# Patient Record
Sex: Female | Born: 1935 | Race: White | Hispanic: No | Smoking: Former smoker
Health system: Southern US, Community
[De-identification: ages and names within clinical notes are randomized; demographics above are authoritative.]

## PROBLEM LIST (undated history)

## (undated) DIAGNOSIS — I728 Aneurysm of other specified arteries: Secondary | ICD-10-CM

## (undated) DIAGNOSIS — C189 Malignant neoplasm of colon, unspecified: Secondary | ICD-10-CM

## (undated) DIAGNOSIS — I10 Essential (primary) hypertension: Secondary | ICD-10-CM

## (undated) DIAGNOSIS — C679 Malignant neoplasm of bladder, unspecified: Secondary | ICD-10-CM

## (undated) DIAGNOSIS — M549 Dorsalgia, unspecified: Secondary | ICD-10-CM

## (undated) DIAGNOSIS — Z8551 Personal history of malignant neoplasm of bladder: Secondary | ICD-10-CM

## (undated) HISTORY — PX: ABDOMINAL SURGERY: SHX537

## (undated) HISTORY — PX: TEMPORAL ARTERY BIOPSY / LIGATION: SUR132

## (undated) HISTORY — PX: ABDOMINAL HYSTERECTOMY: SHX81

---

## 2009-01-30 LAB — URINALYSIS W/ REFLEX CULTURE
Bacteria: NEGATIVE /HPF
Bilirubin: NEGATIVE
Glucose: NEGATIVE MG/DL
Ketone: NEGATIVE MG/DL
Nitrites: NEGATIVE
Specific gravity: 1.011 (ref 1.003–1.030)
Urobilinogen: 0.2 EU/DL (ref 0.2–1.0)
pH (UA): 6 (ref 5.0–8.0)

## 2009-01-30 LAB — CBC WITH AUTOMATED DIFF
ABS. BASOPHILS: 0 10*3/uL (ref 0.0–0.1)
ABS. EOSINOPHILS: 0.1 10*3/uL (ref 0.0–0.4)
ABS. LYMPHOCYTES: 1.4 10*3/uL (ref 0.8–3.5)
ABS. MONOCYTES: 0.4 10*3/uL (ref 0.0–1.0)
ABS. NEUTROPHILS: 3.9 10*3/uL (ref 1.8–8.0)
BASOPHILS: 0 % (ref 0–1)
EOSINOPHILS: 1 % (ref 0–7)
HCT: 41.5 % (ref 35.0–47.0)
HGB: 14.1 g/dL (ref 11.5–16.0)
LYMPHOCYTES: 25 % (ref 12–49)
MCH: 28.7 PG (ref 26.0–34.0)
MCHC: 34 g/dL (ref 30.0–36.5)
MCV: 84.5 FL (ref 80.0–99.0)
MONOCYTES: 7 % (ref 5–13)
NEUTROPHILS: 67 % (ref 32–75)
PLATELET: 211 10*3/uL (ref 150–400)
RBC: 4.91 M/uL (ref 3.80–5.20)
RDW: 13.5 % (ref 11.5–14.5)
WBC: 5.8 10*3/uL (ref 3.6–11.0)

## 2009-01-30 LAB — METABOLIC PANEL, COMPREHENSIVE
A-G Ratio: 1.1 (ref 1.1–2.2)
ALT (SGPT): 22 U/L (ref 12–78)
AST (SGOT): 16 U/L (ref 15–37)
Albumin: 3.8 g/dL (ref 3.5–5.0)
Alk. phosphatase: 60 U/L (ref 50–136)
Anion gap: 11 mmol/L (ref 5–15)
BUN/Creatinine ratio: 28 — ABNORMAL HIGH (ref 12–20)
BUN: 22 MG/DL — ABNORMAL HIGH (ref 6–20)
Bilirubin, total: 0.6 MG/DL (ref 0.2–1.0)
CO2: 28 MMOL/L (ref 21–32)
Calcium: 9.1 MG/DL (ref 8.5–10.1)
Chloride: 99 MMOL/L (ref 97–108)
Creatinine: 0.8 mg/dL (ref 0.6–1.3)
GFR est AA: >60 mL/min/{1.73_m2} (ref >60)
GFR est non-AA: >60 mL/min/{1.73_m2} (ref >60)
Globulin: 3.6 g/dL (ref 2.0–4.0)
Glucose: 92 MG/DL (ref 65–100)
Potassium: 4.2 MMOL/L (ref 3.5–5.1)
Protein, total: 7.4 g/dL (ref 6.4–8.2)
Sodium: 138 MMOL/L (ref 136–145)

## 2009-01-30 LAB — PROTHROMBIN TIME + INR
INR: 1 (ref 0.9–1.1)
Prothrombin time: 10 SECS (ref 9.0–11.0)

## 2009-01-30 LAB — PTT: aPTT: 27.5 s (ref 24.0–33.0)

## 2009-02-01 LAB — CULTURE, URINE
Colonies Counted: <1000
Colony Count: <1000
Culture result:: NO GROWTH
Culture: NO GROWTH

## 2009-02-12 NOTE — Op Note (Signed)
Op Notes filed by Arnetha Courser, MD at 02/13/09 1543                Author: Arnetha Courser, MD  Service: --  Author Type: Physician       Filed: 02/13/09 1543  Date of Service: 02/12/09 2325  Status: Addendum          Editor: Arnetha Courser, MD (Physician)          <!--EPICS--> Name:      Melissa Travis, Melissa Travis<BR> MR #:      [id number]                    Surgeon:        Arnetha Courser, MD<BR> Account #: [id number]                  Surgery Date:   02/12/2009<BR> DOB:       17-Jul-1934<BR> Age:       75                           Location:       5E2 512 A<BR> <BR>                              OPERATIVE REPORT<BR> <BR> <BR> <BR> PREOPERATIVE DIAGNOSIS:  Parastomal hernia.<BR>  <BR> POSTOPERATIVE DIAGNOSIS:  Parastomal hernia.<BR> <BR> PROCEDURE:  Repair of parastomal hernia.<BR> <BR> ESTIMATED BLOOD LOSS:<BR> <BR> SPECIMENS:<BR> <BR> PROCEDURE IN DETAIL:  After anesthesia, she was prepped and draped in a<BR> sterile manner.   Foley catheter was placed into the ileal conduit.  The<BR> balloon was inflated with 5 mL, and the stoma was closed with pursestring<BR> suture of 3-0 silk.  A circular incision was made around the stoma.  This<BR> was deepened to subcutaneous tissues  and eventually down to the level of<BR> the fascia.  The hernia was opened and excised.  The stoma was completely<BR> removed from the fascia in 360 degrees, mobilized, and then the<BR> undersurface of the abdominal wall was dissected so that it could  be<BR> brought together without harming in the intra-abdominal structures.  Using<BR> interrupted figure-of-eight sutures of #1 PDS, the abdominal wall fascia<BR> was brought together to tightened it around the stoma.  Three such sutures<BR> were placed  laterally and one inferiorly.  This closed the fascia nicely<BR> again to the stoma without strangulation.  At this point, the level of the<BR> stoma was adjusted and then interrupted 3-0 silk sutures were placed<BR> circumferentially  between the fascia  and then ileal conduit.  The wound was<BR> irrigated with antibiotics irrigation.  Hemostasis was obtained and then<BR> the skin edges were closed with interrupted 3-0 Vicryl sutures.  Sterile<BR> dressing was applied.  The patient was transferred to  the recovery in<BR> stable condition.<BR> <BR> <BR> <BR> <BR> E-Signed By<BR> Arnetha Courser, MD 02/13/2009 15:43<BR> Arnetha Courser, MD<BR> <BR> cc:   Arnetha Courser, MD<BR> <BR> <BR> COPY TO:  [facility]<BR> <BR> <BR> WRM/FI3;  D: 02/12/2009  1:02 P; T: 02/12/2009 11:25 P; Doc# [id number]; Job#<BR> [id number]<IO> <!--EPICE-->

## 2009-02-12 NOTE — Op Note (Addendum)
Name: Melissa Travis, Melissa Travis  MR #: [id number] Surgeon: Arnetha Courser, MD  Account #: [id number] Surgery Date: 02/12/2009  DOB: [birth year]  Age: 75 Location: 5E2 512 A     OPERATIVE REPORT        PREOPERATIVE DIAGNOSIS: Parastomal hernia.    POSTOPERATIVE DIAGNOSIS: Parastomal hernia.    PROCEDURE: Repair of parastomal hernia.    ESTIMATED BLOOD LOSS:    SPECIMENS:    PROCEDURE IN DETAIL: After anesthesia, she was prepped and draped in a  sterile manner. Foley catheter was placed into the ileal conduit. The  balloon was inflated with 5 mL, and the stoma was closed with pursestring  suture of 3-0 silk. A circular incision was made around the stoma. This  was deepened to subcutaneous tissues and eventually down to the level of  the fascia. The hernia was opened and excised. The stoma was completely  removed from the fascia in 360 degrees, mobilized, and then the  undersurface of the abdominal wall was dissected so that it could be  brought together without harming in the intra-abdominal structures. Using  interrupted figure-of-eight sutures of #1 PDS, the abdominal wall fascia  was brought together to tightened it around the stoma. Three such sutures  were placed laterally and one inferiorly. This closed the fascia nicely  again to the stoma without strangulation. At this point, the level of the  stoma was adjusted and then interrupted 3-0 silk sutures were placed  circumferentially between the fascia and then ileal conduit. The wound was  irrigated with antibiotics irrigation. Hemostasis was obtained and then  the skin edges were closed with interrupted 3-0 Vicryl sutures. Sterile  dressing was applied. The patient was transferred to the recovery in  stable condition.          E-Signed By  Arnetha Courser, MD 02/13/2009 15:43  Arnetha Courser, MD    cc: Arnetha Courser, MD      COPY TO: [facility]      WRM/FI3; D: 02/12/2009 1:02 P; T: 02/12/2009 11:25 P; Doc# [id number]; Job#  [id number]

## 2009-09-19 ENCOUNTER — Encounter

## 2011-02-24 LAB — CK W/ CKMB & INDEX
CK - MB: 1.1 NG/ML (ref 0.5–3.6)
CK-MB Index: 0.7 (ref 0–2.5)
CK: 151 U/L (ref 26–192)

## 2011-02-24 LAB — URINALYSIS W/ RFLX MICROSCOPIC
Bilirubin: NEGATIVE
Glucose: NEGATIVE MG/DL
Ketone: NEGATIVE MG/DL
Nitrites: POSITIVE — AB
Protein: NEGATIVE MG/DL
Specific gravity: 1.01 (ref 1.003–1.030)
Urobilinogen: 0.2 EU/DL (ref 0.2–1.0)
pH (UA): 7 (ref 5.0–8.0)

## 2011-02-24 LAB — URINE MICROSCOPIC ONLY

## 2011-02-24 LAB — EKG, 12 LEAD, INITIAL
Atrial Rate: 64 {beats}/min
Calculated P Axis: 38 degrees
Calculated R Axis: -15 degrees
Calculated T Axis: 12 degrees
Diagnosis: NORMAL
P-R Interval: 150 ms
Q-T Interval: 422 ms
QRS Duration: 88 ms
QTC Calculation (Bezet): 435 ms
Ventricular Rate: 64 {beats}/min

## 2011-02-24 LAB — CBC WITH AUTOMATED DIFF
ABS. BASOPHILS: 0 10*3/uL (ref 0.0–0.1)
ABS. EOSINOPHILS: 0.1 10*3/uL (ref 0.0–0.4)
ABS. LYMPHOCYTES: 1.2 10*3/uL (ref 0.8–3.5)
ABS. MONOCYTES: 0.7 10*3/uL (ref 0.0–1.0)
ABS. NEUTROPHILS: 7.2 10*3/uL (ref 1.8–8.0)
BASOPHILS: 0 % (ref 0–1)
EOSINOPHILS: 1 % (ref 0–7)
HCT: 38.8 % (ref 35.0–47.0)
HGB: 13.6 g/dL (ref 11.5–16.0)
LYMPHOCYTES: 13 % (ref 12–49)
MCH: 29.6 PG (ref 26.0–34.0)
MCHC: 35.1 g/dL (ref 30.0–36.5)
MCV: 84.5 FL (ref 80.0–99.0)
MONOCYTES: 8 % (ref 5–13)
NEUTROPHILS: 78 % — ABNORMAL HIGH (ref 32–75)
PLATELET: 235 10*3/uL (ref 150–400)
RBC: 4.59 M/uL (ref 3.80–5.20)
RDW: 12.5 % (ref 11.5–14.5)
WBC: 9.2 10*3/uL (ref 3.6–11.0)

## 2011-02-24 LAB — METABOLIC PANEL, COMPREHENSIVE
A-G Ratio: 0.7 — ABNORMAL LOW (ref 1.1–2.2)
ALT (SGPT): 33 U/L (ref 12–78)
AST (SGOT): 19 U/L (ref 15–37)
Albumin: 3.1 g/dL — ABNORMAL LOW (ref 3.5–5.0)
Alk. phosphatase: 82 U/L (ref 50–136)
Anion gap: 3 mmol/L — ABNORMAL LOW (ref 5–15)
BUN/Creatinine ratio: 18 (ref 12–20)
BUN: 16 MG/DL (ref 6–20)
Bilirubin, total: 0.5 MG/DL (ref 0.2–1.0)
CO2: 35 MMOL/L — ABNORMAL HIGH (ref 21–32)
Calcium: 8.4 MG/DL — ABNORMAL LOW (ref 8.5–10.1)
Chloride: 100 MMOL/L (ref 97–108)
Creatinine: 0.9 MG/DL (ref 0.6–1.3)
GFR est AA: >60 mL/min/{1.73_m2} (ref >60)
GFR est non-AA: >60 mL/min/{1.73_m2} (ref >60)
Globulin: 4.2 g/dL — ABNORMAL HIGH (ref 2.0–4.0)
Glucose: 100 MG/DL (ref 65–100)
Potassium: 3.6 MMOL/L (ref 3.5–5.1)
Protein, total: 7.3 g/dL (ref 6.4–8.2)
Sodium: 138 MMOL/L (ref 136–145)

## 2011-02-24 LAB — TROPONIN I: Troponin-I, Qt.: <0.04 ng/mL (ref <0.05)

## 2011-02-24 MED ORDER — OXYCODONE-ACETAMINOPHEN 5 MG-325 MG TAB
5-325 mg | ORAL_TABLET | ORAL | Status: AC | PRN
Start: 2011-02-24 — End: 2011-03-10

## 2011-02-24 MED ORDER — DOCUSATE SODIUM 100 MG CAP
100 mg | ORAL_CAPSULE | Freq: Two times a day (BID) | ORAL | Status: AC
Start: 2011-02-24 — End: 2011-05-25

## 2011-02-24 MED ORDER — DOXYCYCLINE HYCLATE 100 MG TAB
100 mg | Freq: Two times a day (BID) | ORAL | Status: DC
Start: 2011-02-24 — End: 2011-02-24
  Administered 2011-02-24: 11:00:00 via ORAL

## 2011-02-24 MED ORDER — DOXYCYCLINE 100 MG CAP
100 mg | ORAL_CAPSULE | Freq: Two times a day (BID) | ORAL | Status: AC
Start: 2011-02-24 — End: 2011-03-10

## 2011-02-24 MED ADMIN — naproxen (NAPROSYN) tablet 250 mg: ORAL | @ 12:00:00 | NDC 68462018801

## 2011-02-24 NOTE — ED Provider Notes (Signed)
Patient is a 77 y.o. female presenting with neck pain and joint pain. The history is provided by the patient.   Neck Pain     Joint Pain   Associated symptoms include neck pain.      A 77 year old who comes to the emergency department today complaining of 4 days of neck and joint pain which she describes gradual onset, constant, intense in severity, achy, and exacerbated by movement. There no alleviating factors. Patient does have a pain in her neck left hip and bilateral shoulders. Patient states that she took a tick off of her right inner thigh on Saturday. Patient notes that she felt feverish a few days ago, but she does not know how high her fever was.    Past Medical History   Diagnosis Date   ??? Hypertension    ??? Cancer      bladder, skin cancer        Past Surgical History   Procedure Date   ??? Complete urostomy care    ??? Hx urological      urostomy   ??? Abdomen surgery proc unlisted      tummy tuck         No family history on file.     History     Social History   ??? Marital Status: Married     Spouse Name: N/A     Number of Children: N/A   ??? Years of Education: N/A     Occupational History   ??? Not on file.     Social History Main Topics   ??? Smoking status: Former Smoker   ??? Smokeless tobacco: Not on file   ??? Alcohol Use: No   ??? Drug Use:    ??? Sexually Active:      Other Topics Concern   ??? Not on file     Social History Narrative   ??? No narrative on file                  ALLERGIES: Latex      Review of Systems   Constitutional: Positive for fever.   HENT: Positive for neck pain.    Musculoskeletal: Positive for joint pain and joint swelling.   [all other systems reviewed and are negative        Filed Vitals:    02/24/11 0613 02/24/11 0617   BP:  198/89   Pulse:  67   Temp:  97.8 ??F (36.6 ??C)   Resp:  16   Height: 5\' 2"  (1.575 m)    Weight: 70.308 kg (155 lb)    SpO2:  97%            Physical Exam   [nursing notereviewed.     CONSTITUTIONAL: Well-appearing; well-nourished; in moderate pain   HEAD: Normocephalic; atraumatic  EYES: PERRL; EOM intact; conjunctiva and sclera are clear bilaterally.  ENT: No rhinorrhea; normal pharynx with no tonsillar hypertrophy; mucous membranes pink/moist, no erythema, no exudate.  NECK: tender bilateral paraspinal areas with decreased ROM  CARD: Normal S1, S2; no murmurs, rubs, or gallops. Regular rate and rhythm.  RESP: Normal respiratory effort; breath sounds clear and equal bilaterally; no wheezes, rhonchi, or rales.  ABD: Normal bowel sounds; non-distended; non-tender; no palpable organomegaly, no masses, no bruits.  Back Exam: Normal inspection; no vertebral point tenderness, no CVA tenderness. Normal range of motion.  EXT: Bilateral shoulders tender, decreased ROM of left shoulder, pain with ROM left hip, all joints appear unremarkable  SKIN: Warm; dry; no rash.  NEURO:Alert and oriented x 3, coherent, NII-XII grossly intact, sensory and motor are non-focal.    EKG:  NSR with rate of 64, normal axis, no ectopy or ST changes.    MDM     Differential Diagnosis; Clinical Impression; Plan:     [A/P:  77 year old with arthalgias following a tick bite.  1.  Lyme studies pending  2.  Treat with doxycyline  3.  Percocet prn  3.  Follow up with PCP, return for persistent/worsening symptoms  4.  Patient signed out to oncoming physician      Procedures    None    Leslieann Whisman A. Brinda Focht, MD.  MyChart Activation    Thank you for requesting access to MyChart. Please follow the instructions below to securely access and download your online medical record. MyChart allows you to send messages to your doctor, view your test results, renew your prescriptions, schedule appointments, and more.    How Do I Sign Up?    1. In your internet browser, go to [email or web address]  2. Click on the First Time User? Click Here link in the Sign In box. You will be redirect to the New Member Sign Up page.   3. Enter your MyChart Access Code exactly as it appears below. You will not need to use this code after you???ve completed the sign-up process. If you do not sign up before the expiration date, you must request a new code.    MyChart Access Code: [email or web address] @ (This is the date your MyChart access code will expire)    4. Enter the last four digits of your Social Security Number (xxxx) and Date of Birth (mm/dd/yyyy) as indicated and click Submit. You will be taken to the next sign-up page.  5. Create a MyChart ID. This will be your MyChart login ID and cannot be changed, so think of one that is secure and easy to remember.  6. Create a MyChart password. You can change your password at any time.  7. Enter your Password Reset Question and Answer. This can be used at a later time if you forget your password.   8. Enter your e-mail address. You will receive e-mail notification when new information is available in MyChart.  9. Click Sign Up. You can now view and download portions of your medical record.  10. Click the Download Summary menu link to download a portable copy of your medical information.    Additional Information    If you have questions, please call [phone].  Remember, MyChart is NOT to be used for urgent needs. For medical emergencies, dial 911.

## 2011-02-24 NOTE — ED Notes (Signed)
Per MD ok for patient to take own BP medication. Patient taken losartan and clonidine

## 2011-02-24 NOTE — ED Provider Notes (Signed)
8:50 AM  I assessed the patient after she was signed out by Dr. Sloan Leiter. Patient is feeling improved and her blood pressure is improved. The patient's lab results are normal. The patient's urine does show positive nitrites and leukocytes and culture is pending.  It was taken from urostomy bag so I will hold off on treating with extra antibiotic at this point. The patient is in agreement with this plan. The patient is given prescriptions for doxycycline and percocet and colace. She understands the discharge diagnosis and plan and agrees. Lyme screen is pending. She is to return immediately for any worsening of symptoms.

## 2011-02-24 NOTE — ED Notes (Signed)
C/O neck pain x 5 days, joint pain also,  States she had right leg 2 days ago.

## 2011-02-24 NOTE — ED Notes (Signed)
Dr. Sloan Leiter told the patient that she could take her own morning medications now.

## 2011-02-26 LAB — CULTURE, URINE
Colonies Counted: >100000
Colony Count: >100000

## 2011-02-26 LAB — LYME AB, IGG & IGM BY WB
IgG P18 Ab: ABSENT
IgG P23 Ab: ABSENT
IgG P28 Ab: ABSENT
IgG P30 Ab: ABSENT
IgG P39 Ab: ABSENT
IgG P45 Ab: ABSENT
IgG P58 Ab: ABSENT
IgG P66 Ab: ABSENT
IgG P93 Ab: ABSENT
IgM P39 Ab: ABSENT
IgM P41 Ab: ABSENT
Lyme Ab, IgG WB Interp.: NEGATIVE
Lyme Ab, IgM WB Interp.: NEGATIVE

## 2011-02-26 NOTE — Progress Notes (Signed)
Quick Note:    Pt started on Doxycycline in ED. Per ED note, pt will f/u w/ PCP for Lyme results. Treatment from ED initiated.  ______

## 2011-07-06 LAB — CBC WITH AUTOMATED DIFF
ABS. BASOPHILS: 0 10*3/uL (ref 0.0–0.1)
ABS. EOSINOPHILS: 0 10*3/uL (ref 0.0–0.4)
ABS. LYMPHOCYTES: 1.1 10*3/uL (ref 0.8–3.5)
ABS. MONOCYTES: 0.7 10*3/uL (ref 0.0–1.0)
ABS. NEUTROPHILS: 7 10*3/uL (ref 1.8–8.0)
BASOPHILS: 0 % (ref 0–1)
EOSINOPHILS: 1 % (ref 0–7)
HCT: 37.3 % (ref 35.0–47.0)
HGB: 12.7 g/dL (ref 11.5–16.0)
LYMPHOCYTES: 12 % (ref 12–49)
MCH: 27.9 PG (ref 26.0–34.0)
MCHC: 34 g/dL (ref 30.0–36.5)
MCV: 82 FL (ref 80.0–99.0)
MONOCYTES: 8 % (ref 5–13)
NEUTROPHILS: 79 % — ABNORMAL HIGH (ref 32–75)
PLATELET: 234 10*3/uL (ref 150–400)
RBC: 4.55 M/uL (ref 3.80–5.20)
RDW: 13.8 % (ref 11.5–14.5)
WBC: 8.9 10*3/uL (ref 3.6–11.0)

## 2011-07-06 LAB — METABOLIC PANEL, BASIC
Anion gap: 9 mmol/L (ref 5–15)
BUN/Creatinine ratio: 26 — ABNORMAL HIGH (ref 12–20)
BUN: 22 MG/DL — ABNORMAL HIGH (ref 6–20)
CO2: 29 MMOL/L (ref 21–32)
Calcium: 9.2 MG/DL (ref 8.5–10.1)
Chloride: 102 MMOL/L (ref 97–108)
Creatinine: 0.85 MG/DL (ref 0.45–1.15)
GFR est AA: >60 mL/min/{1.73_m2} (ref >60)
GFR est non-AA: >60 mL/min/{1.73_m2} (ref >60)
Glucose: 101 MG/DL — ABNORMAL HIGH (ref 65–100)
Potassium: 4 MMOL/L (ref 3.5–5.1)
Sodium: 140 MMOL/L (ref 136–145)

## 2011-07-06 NOTE — ED Notes (Signed)
Trice PA at bedside.

## 2011-07-06 NOTE — ED Notes (Signed)
Pt given discharge instructions and letter from [facility]. Pt ambulated out of ED. Pt will follow up with PCP as needed.

## 2011-07-06 NOTE — ED Notes (Signed)
Pt states no cramping for the past month but all joints are achy.

## 2011-07-06 NOTE — ED Notes (Signed)
Pt states off and on swelling and pain in bilateral hands and arms that shoots to her breast when she raises her arms.  Pt also got a letter that said her eye medication may have been contaminated. Pt feels she should be checked for meningitis.  Pt states having cramps in legs and arms since august as well.

## 2011-07-06 NOTE — ED Notes (Signed)
Pt states she has bilateral hand swelling with pain that radiates into her breast.  Swelling about 1 week ago.  Pt states that she was seen at [facility] center and they thought she might have lyme's disease.  Pt also states she also got a letter from here MD that states she received contaminated medication from [place].

## 2011-07-06 NOTE — ED Notes (Signed)
Pt complaining of neck pain, headache, and "bumps in my head".

## 2011-07-06 NOTE — ED Notes (Signed)
Pt ambulated to restroom.

## 2011-07-06 NOTE — ED Provider Notes (Addendum)
HPI Comments: Melissa Travis presents with bilateral wrist swelling. Melissa Travis reports that swelling has been intermittent for 2 weeks. Melissa Travis also reports joints aching. Denies fever or chills. Melissa Travis seen by PCP and [facility] and tested for lyme disease. Melissa Travis treated empircally with doxycycline. Melissa Travis denies any injury. Melissa Travis also reports receiving letter from eye doctor that she may have received eye injections of medication that was contaminated from [place] [facility] [place] [facility]. Melissa Travis denies headache, stiff neck.     Melissa Travis is a 77 y.o. female presenting with hand swelling. The history is provided by the Melissa Travis.   Hand Swelling   This is a new problem. The current episode started more than 2 days ago. The problem occurs constantly. The problem has been gradually improving. The pain is present in the right wrist and left wrist. The quality of the pain is described as aching. The pain is at a severity of 8/10. The pain is severe. Associated symptoms include limited range of motion and back pain. Pertinent negatives include no numbness, no stiffness, no tingling, no itching and no neck pain. The symptoms are aggravated by movement. She has tried nothing for the symptoms. There has been no history of extremity trauma.        Past Medical History   Diagnosis Date   ??? Hypertension    ??? Cancer      bladder, skin cancer   ??? Macular degeneration, bilateral         Past Surgical History   Procedure Date   ??? Complete urostomy care    ??? Hx urological      urostomy   ??? Pr abdomen surgery proc unlisted      tummy tuck   ??? Hx hysterectomy          No family history on file.     History     Social History   ??? Marital Status: Married     Spouse Name: N/A     Number of Children: N/A   ??? Years of Education: N/A     Occupational History   ??? Not on file.     Social History Main Topics   ??? Smoking status: Former Smoker   ??? Smokeless tobacco: Not on file   ??? Alcohol Use: No   ??? Drug Use:    ??? Sexually Active:       Other Topics Concern   ??? Not on file     Social History Narrative   ??? No narrative on file                  ALLERGIES: Latex      Review of Systems   Constitutional: Negative.    HENT: Negative.  Negative for neck pain.    Eyes: Negative.    Respiratory: Negative.    Cardiovascular: Negative.    Gastrointestinal: Negative.    Genitourinary: Negative.    Musculoskeletal: Positive for back pain, joint swelling and arthralgias. Negative for stiffness.   Skin: Negative.  Negative for itching.   Neurological: Negative.  Negative for tingling and numbness.   Hematological: Negative.    Psychiatric/Behavioral: Negative.    All other systems reviewed and are negative.        Filed Vitals:    07/06/11 1215   BP: 142/77   Pulse: 76   Temp: 97.7 ??F (36.5 ??C)   Resp: 18   Height: 5\' 3"  (1.6 m)   Weight: 68.04 kg (150 lb)   SpO2: 97%  Physical Exam   Nursing note and vitals reviewed.  Constitutional: She is oriented to person, place, and time. She appears well-developed and well-nourished.   HENT:   Head: Normocephalic and atraumatic.   Right Ear: External ear normal.   Left Ear: External ear normal.   Mouth/Throat: Oropharynx is clear and moist. No oropharyngeal exudate.   Eyes: Conjunctivae and EOM are normal. Pupils are equal, round, and reactive to light. Right eye exhibits no discharge. Left eye exhibits no discharge. No scleral icterus.   Neck: Normal range of motion. No spinous process tenderness and no muscular tenderness present. No tracheal deviation and normal range of motion present. No Brudzinski's sign and no Kernig's sign noted. No thyromegaly present.   Cardiovascular: Normal rate, regular rhythm and normal heart sounds.    No murmur heard.  Pulmonary/Chest: Effort normal and breath sounds normal. No respiratory distress. She has no wheezes. She has no rales. She exhibits no tenderness.    Abdominal: Soft. Bowel sounds are normal. She exhibits no distension. There is no tenderness. There is no rebound and no guarding.   Musculoskeletal: She exhibits no edema and no tenderness.        Right wrist: She exhibits swelling. She exhibits no tenderness, no effusion, no deformity and no laceration.        Left wrist: She exhibits swelling. She exhibits no tenderness, no bony tenderness, no effusion and no crepitus.        Bilateral wrist with mild swelling, no erythema, non-TTP, no warmth to palpation     Lymphadenopathy:     She has no cervical adenopathy.   Neurological: She is alert and oriented to person, place, and time. No cranial nerve deficit. Coordination normal.   Skin: Skin is warm. No erythema.   Psychiatric: She has a normal mood and affect. Her behavior is normal. Judgment and thought content normal.        MDM    Procedures

## 2011-07-07 NOTE — ED Provider Notes (Signed)
I have personally seen and evaluated patient. I find the patient's history and physical exam are consistent with the PA's NP documentation. I agree with the care provided, treatments rendered, disposition and follow up plan.

## 2012-01-30 ENCOUNTER — Encounter

## 2012-05-07 LAB — CBC WITH AUTOMATED DIFF
ABS. BASOPHILS: 0 10*3/uL (ref 0.0–0.1)
ABS. EOSINOPHILS: 0 10*3/uL (ref 0.0–0.4)
ABS. LYMPHOCYTES: 1 10*3/uL (ref 0.8–3.5)
ABS. MONOCYTES: 0.4 10*3/uL (ref 0.0–1.0)
ABS. NEUTROPHILS: 4.3 10*3/uL (ref 1.8–8.0)
BASOPHILS: 0 % (ref 0–1)
EOSINOPHILS: 0 % (ref 0–7)
HCT: 41.6 % (ref 35.0–47.0)
HGB: 14.5 g/dL (ref 11.5–16.0)
LYMPHOCYTES: 18 % (ref 12–49)
MCH: 28.9 PG (ref 26.0–34.0)
MCHC: 34.9 g/dL (ref 30.0–36.5)
MCV: 83 FL (ref 80.0–99.0)
MONOCYTES: 8 % (ref 5–13)
NEUTROPHILS: 74 % (ref 32–75)
PLATELET: 164 10*3/uL (ref 150–400)
RBC: 5.01 M/uL (ref 3.80–5.20)
RDW: 13.2 % (ref 11.5–14.5)
WBC: 5.7 10*3/uL (ref 3.6–11.0)

## 2012-05-07 LAB — METABOLIC PANEL, COMPREHENSIVE
A-G Ratio: 0.9 — ABNORMAL LOW (ref 1.1–2.2)
ALT (SGPT): 26 U/L (ref 12–78)
AST (SGOT): 23 U/L (ref 15–37)
Albumin: 3.8 g/dL (ref 3.5–5.0)
Alk. phosphatase: 94 U/L (ref 50–136)
Anion gap: 7 mmol/L (ref 5–15)
BUN/Creatinine ratio: 29 — ABNORMAL HIGH (ref 12–20)
BUN: 20 MG/DL (ref 6–20)
Bilirubin, total: 0.6 MG/DL (ref 0.2–1.0)
CO2: 29 MMOL/L (ref 21–32)
Calcium: 7.8 MG/DL — ABNORMAL LOW (ref 8.5–10.1)
Chloride: 99 MMOL/L (ref 97–108)
Creatinine: 0.69 MG/DL (ref 0.45–1.15)
GFR est AA: >60 mL/min/{1.73_m2} (ref >60)
GFR est non-AA: >60 mL/min/{1.73_m2} (ref >60)
Globulin: 4.1 g/dL — ABNORMAL HIGH (ref 2.0–4.0)
Glucose: 94 MG/DL (ref 65–100)
Potassium: 3.5 MMOL/L (ref 3.5–5.1)
Protein, total: 7.9 g/dL (ref 6.4–8.2)
Sodium: 135 MMOL/L — ABNORMAL LOW (ref 136–145)

## 2012-05-07 LAB — EKG, 12 LEAD, INITIAL
Atrial Rate: 64 {beats}/min
Calculated P Axis: 21 degrees
Calculated R Axis: -12 degrees
Calculated T Axis: 20 degrees
P-R Interval: 148 ms
Q-T Interval: 444 ms
QRS Duration: 92 ms
QTC Calculation (Bezet): 458 ms
Ventricular Rate: 64 {beats}/min

## 2012-05-07 LAB — POC TROPONIN-I: Troponin-I (POC): <0.04 ng/mL (ref 0.00–0.08)

## 2012-05-07 MED ORDER — SODIUM CHLORIDE 0.9 % IJ SYRG
INTRAMUSCULAR | Status: DC | PRN
Start: 2012-05-07 — End: 2012-05-07

## 2012-05-07 MED ORDER — SODIUM CHLORIDE 0.9 % IJ SYRG
Freq: Three times a day (TID) | INTRAMUSCULAR | Status: DC
Start: 2012-05-07 — End: 2012-05-07

## 2012-05-07 MED ADMIN — pantoprazole (PROTONIX) injection 40 mg: INTRAVENOUS | @ 14:00:00 | NDC 00008400101

## 2012-05-07 MED FILL — PROTONIX 40 MG INTRAVENOUS SOLUTION: 40 mg | INTRAVENOUS | Qty: 40

## 2012-05-07 NOTE — ED Notes (Signed)
Discharge instructions discussed with patient.  Patient given opportunity to ask questions and all questions answered.  Patient verbalizes understanding.  Ambulatory out of ED.

## 2012-05-07 NOTE — ED Notes (Addendum)
Patient was seen for pain in her right under arm and had a scan done recently. States she was told to see her doctor due to "something they saw on the scan." Pt presents with paperwork that states carotid artery disease. PCP is out of town. BP is elevated in triage, pt with hx of HTN.

## 2012-05-07 NOTE — ED Notes (Signed)
Long standing burning epigastric pain, no change, just noted episode last noc.  Sharp right sided chest pain is resolved, with no PE or TAD RF. BP is chronically elevated. No signs of hypertensive urgency or emergency. Discussed need to see PCM about mild changes on carotid US performed by screening company.

## 2012-05-07 NOTE — ED Provider Notes (Signed)
I have personally seen and evaluated patient. I find the patient's history and physical exam are consistent with the PA's NP documentation. I agree with the care provided, treatments rendered, disposition and follow up plan.

## 2012-05-07 NOTE — ED Provider Notes (Signed)
Patient is a 78 y.o. female presenting with seen by non ed provider and rib pain. The history is provided by the patient.   Seen By Non-ED Provider  This is a new (was seen at a health screening at a local church, was told she has mild/mod coronary artery disease and carotid artery disease) problem. Associated symptoms include chest pain (right sided rib pain) and abdominal pain (epigastric). Pertinent negatives include no headaches.   Rib Pain  This is a new problem. The problem occurs continuously.The current episode started yesterday. The problem has not changed since onset.Associated symptoms include chest pain (right sided rib pain) and abdominal pain (epigastric). Pertinent negatives include no fever, no headaches, no coryza, no rhinorrhea, no sore throat, no swollen glands, no ear pain, no neck pain, no cough, no sputum production, no hemoptysis, no wheezing, no PND, no orthopnea, no syncope, no vomiting, no rash, no leg pain, no leg swelling and no claudication. She has tried nothing for the symptoms. She has had no prior hospitalizations. She has had no prior ED visits. She has had no prior ICU admissions. Associated medical issues comments: was seen by GI, had swallow study several mos ago.        Past Medical History   Diagnosis Date   ??? Hypertension    ??? Cancer      bladder, skin cancer   ??? Macular degeneration, bilateral         Past Surgical History   Procedure Date   ??? Complete urostomy care    ??? Hx urological      urostomy   ??? Pr abdomen surgery proc unlisted      tummy tuck   ??? Hx hysterectomy          History reviewed. No pertinent family history.     History     Social History   ??? Marital Status: MARRIED     Spouse Name: N/A     Number of Children: N/A   ??? Years of Education: N/A     Occupational History   ??? Not on file.     Social History Main Topics   ??? Smoking status: Former Smoker   ??? Smokeless tobacco: Not on file   ??? Alcohol Use: No   ??? Drug Use:    ??? Sexually Active:      Other Topics  Concern   ??? Not on file     Social History Narrative   ??? No narrative on file                  ALLERGIES: Latex      Review of Systems   Constitutional: Negative for fever, chills and appetite change.   HENT: Negative for ear pain, sore throat, rhinorrhea and neck pain.    Respiratory: Negative for cough, hemoptysis, sputum production, chest tightness and wheezing.    Cardiovascular: Positive for chest pain (right sided rib pain). Negative for palpitations, orthopnea, claudication, leg swelling, syncope and PND.   Gastrointestinal: Positive for abdominal pain (epigastric). Negative for nausea, vomiting and diarrhea.   Genitourinary: Negative for dysuria, flank pain, decreased urine volume, difficulty urinating and pelvic pain.        Has urostomy from previous h/o cancer, had bladder removed   Musculoskeletal: Negative for back pain, joint swelling and gait problem.   Skin: Negative for pallor, rash and wound.   Neurological: Negative for dizziness and headaches.       Filed Vitals:  05/07/12 0913 05/07/12 0922   BP: 208/112 176/74   Pulse: 75    Temp: 98.1 ??F (36.7 ??C)    Resp: 16    Height: 5\' 3"  (1.6 m)    Weight: 69.4 kg (153 lb)    SpO2: 97%             Physical Exam   Nursing note and vitals reviewed.  Constitutional: She is oriented to person, place, and time. She appears well-developed and well-nourished.   HENT:   Head: Normocephalic and atraumatic.   Eyes: EOM are normal. Pupils are equal, round, and reactive to light.   Neck: Normal range of motion. Neck supple. No thyromegaly present.   Cardiovascular: Normal rate, regular rhythm, normal heart sounds and intact distal pulses.    Pulmonary/Chest: Effort normal and breath sounds normal. No respiratory distress. She has no wheezes. She has no rales. She exhibits no tenderness.   Abdominal: Soft. Bowel sounds are normal. She exhibits no distension and no mass. There is tenderness (epigastric tenderness with palpation). There is no rebound and no guarding.    Musculoskeletal: Normal range of motion. She exhibits no edema and no tenderness.   Lymphadenopathy:     She has no cervical adenopathy.   Neurological: She is alert and oriented to person, place, and time. No cranial nerve deficit.   Skin: Skin is warm.   Psychiatric: She has a normal mood and affect.        MDM     Differential Diagnosis; Clinical Impression; Plan:     Ddx:  Musculoskeletal pain  gerd  cardiac    Amount and/or Complexity of Data Reviewed:   Clinical lab tests:  Ordered and reviewed  Tests in the radiology section of CPT??:  Ordered and reviewed  Tests in the medicine section of the CPT??:  Ordered and reviewed  Discussion of test results with the performing providers:  Yes   Discuss the patient with another provider:  Yes   Independant visualization of image, tracing, or specimen:  Yes  Risk of Significant Complications, Morbidity, and/or Mortality:   Presenting problems:  Moderate  Diagnostic procedures:  Moderate  Management options:  Moderate      Procedures    ED EKG interpretation:  Rhythm: normal sinus rhythm with pacs; and regular . Rate (approx.): 64; Axis: normal; P wave: normal; QRS interval: normal ; ST/T wave: normal; Other findings: normal. This EKG was interpreted by Leanord Asal, NP,ED Provider.      XR CHEST PORT (Final result)   Result time:05/07/12 1003      Final result by Rad Results In Edi (05/07/12 10:03:00)      Narrative:    **Final Report**      ICD Codes / Adm.Diagnosis: [id number] / Seen By Non-ED Provider Rib   Pain  Examination: CR CHEST PORT - [id number] - May 07 2012 9:58AM  Accession No: [id number]  Reason: right sided chest and rib pain      REPORT:  INDICATION: Seen By Non-ED Provider Rib Pain. right sided chest and rib pain  COMPARISON: Previous chest xray, 01/30/2012.  LIMITATIONS: Portable technique.  Marland Kitchen  FINDINGS: Single frontal view of the chest.  .  Lines/tubes/surgical: None.  Heart/mediastinum: Calcifications in the aortic arch  Lungs/pleura: No focal  consolidation or mass. No visualized pleural   effusion or pneumothorax.  Additional Comments: None.  Marland Kitchen     IMPRESSION:  1. No radiographic evidence of acute cardiopulmonary disease.  2. Atherosclerosis.  Signing/Reading Doctor: Elsie Saas [phone])   ApprovedElsie Saas [phone]) May 07 2012 10:01AM      Assessment: rib pain, epigastric pain  Plan: discharge home, follow up with her primary care provider

## 2012-05-07 NOTE — ED Notes (Signed)
Pt also c/o right rib pain and sternal pain.

## 2013-03-06 MED ADMIN — ibuprofen (MOTRIN) tablet 400 mg: ORAL | @ 14:00:00 | NDC 55111068205

## 2013-03-06 MED FILL — IBUPROFEN 400 MG TAB: 400 mg | ORAL | Qty: 1

## 2013-03-06 NOTE — ED Provider Notes (Signed)
HPI Comments: 79 yo female with h/o varicose veins, hypertension, remote history of bladder and skin cancer here with left posterior lateral midcalf pain with onset last night. Patient states it is bothering her some last night when she went to bed. It awoke her at 4 AM today with 7/10 pain. She reports that her left leg is somewhat more swollen than normal. The pain is constant and worse with walking. She took 2 Tylenol this morning with no relief. Denies any chest pain, shortness of breath, fever, chills, trauma.. She does state that she walks more yesterday than normal. No prior history of blood clots.    Social history:  Former smoker. No alcohol or drug use.    The history is provided by the patient.        Past Medical History   Diagnosis Date   ??? Hypertension    ??? Cancer      bladder, skin cancer   ??? Macular degeneration, bilateral    ??? Varicose veins of both legs with pain         Past Surgical History   Procedure Laterality Date   ??? Complete urostomy care     ??? Hx urological       urostomy   ??? Pr abdomen surgery proc unlisted       tummy tuck   ??? Hx hysterectomy           History reviewed. No pertinent family history.     History     Social History   ??? Marital Status: MARRIED     Spouse Name: N/A     Number of Children: N/A   ??? Years of Education: N/A     Occupational History   ??? Not on file.     Social History Main Topics   ??? Smoking status: Former Smoker   ??? Smokeless tobacco: Never Used   ??? Alcohol Use: No   ??? Drug Use: No   ??? Sexually Active: Not on file     Other Topics Concern   ??? Not on file     Social History Narrative   ??? No narrative on file                  ALLERGIES: Latex      Review of Systems   Cardiovascular: Positive for leg swelling.   Musculoskeletal:        Left calp ain   All other systems reviewed and are negative.        Filed Vitals:    03/06/13 0823   BP: 167/74   Pulse: 56   Temp: 97.6 ??F (36.4 ??C)   Resp: 16   Height: 5' 2.6" (1.59 m)   Weight: 68.312 kg (150 lb 9.6 oz)   SpO2:  97%            Physical Exam   Nursing note and vitals reviewed.  Constitutional: She is oriented to person, place, and time. She appears well-developed and well-nourished. No distress.   Cardiovascular: Normal rate, regular rhythm and normal heart sounds.    Pulmonary/Chest: Effort normal and breath sounds normal.   Abdominal: Soft. There is no tenderness.   Musculoskeletal:        Legs:  Neurological: She is alert and oriented to person, place, and time. She exhibits normal muscle tone.   Skin: Skin is warm and dry.        MDM     Differential Diagnosis; Clinical Impression; Plan:  Left calf pain with some increased left leg size relative to right.  Will check ultrasound and x-ray.  Pt driving so cannot give narcotics.        Procedures    11:04 AM  Patient's results have been reviewed with them.  Patient and/or family have verbally conveyed their understanding and agreement of the patient's signs, symptoms, diagnosis, treatment and prognosis and additionally agree to follow up as recommended or return to the Emergency Room should their condition change prior to follow-up.  Discharge instructions have also been provided to the patient with some educational information regarding their diagnosis as well a list of reasons why they would want to return to the ER prior to their follow-up appointment should their condition change.

## 2013-03-06 NOTE — ED Notes (Signed)
Pt states that since 430 this morning her left lower leg has been hurting. She has a lot of varicose veins that ache but this is different. She denies any injury to the leg. Denies any SOB

## 2013-03-06 NOTE — ED Notes (Signed)
Pt given discharge instructions by DR Naoma Diener, pt verbalizes an understanding, pt stable at time of discharge.

## 2013-04-15 ENCOUNTER — Encounter

## 2014-08-02 ENCOUNTER — Encounter

## 2014-08-02 ENCOUNTER — Inpatient Hospital Stay: Admit: 2014-08-02 | Payer: MEDICARE | Attending: Urology | Primary: Family Medicine

## 2014-08-02 DIAGNOSIS — R05 Cough: Secondary | ICD-10-CM

## 2014-08-29 ENCOUNTER — Inpatient Hospital Stay
Admit: 2014-08-29 | Discharge: 2014-08-29 | Disposition: A | Discharge status: Home / Self Care | Payer: MEDICARE | Attending: Emergency Medicine

## 2014-08-29 DIAGNOSIS — R0781 Pleurodynia: Secondary | ICD-10-CM

## 2014-08-29 LAB — D-DIMER, QUANTITATIVE: D-Dimer, Quant: 1.68 mg/L FEU — ABNORMAL HIGH (ref 0.00–0.65)

## 2014-08-29 LAB — METABOLIC PANEL, COMPREHENSIVE
A-G Ratio: 0.9 — ABNORMAL LOW (ref 1.1–2.2)
ALT (SGPT): 24 U/L (ref 12–78)
AST (SGOT): 22 U/L (ref 15–37)
Albumin: 3.6 g/dL (ref 3.5–5.0)
Alk. phosphatase: 81 U/L (ref 45–117)
Anion gap: 8 mmol/L (ref 5–15)
BUN/Creatinine ratio: 16 (ref 12–20)
BUN: 16 MG/DL (ref 6–20)
Bilirubin, total: 0.6 MG/DL (ref 0.2–1.0)
CO2: 32 mmol/L (ref 21–32)
Calcium: 8.9 MG/DL (ref 8.5–10.1)
Chloride: 98 mmol/L (ref 97–108)
Creatinine: 0.98 MG/DL (ref 0.55–1.02)
GFR est AA: >60 mL/min/{1.73_m2} (ref >60)
GFR est non-AA: 55 mL/min/{1.73_m2} — ABNORMAL LOW (ref >60)
Globulin: 3.8 g/dL (ref 2.0–4.0)
Glucose: 79 mg/dL (ref 65–100)
Potassium: 3.4 mmol/L — ABNORMAL LOW (ref 3.5–5.1)
Protein, total: 7.4 g/dL (ref 6.4–8.2)
Sodium: 138 mmol/L (ref 136–145)

## 2014-08-29 LAB — CBC WITH AUTOMATED DIFF
ABS. BASOPHILS: 0 10*3/uL (ref 0.0–0.1)
ABS. EOSINOPHILS: 0.1 10*3/uL (ref 0.0–0.4)
ABS. LYMPHOCYTES: 1 10*3/uL (ref 0.8–3.5)
ABS. MONOCYTES: 0.5 10*3/uL (ref 0.0–1.0)
ABS. NEUTROPHILS: 4.7 10*3/uL (ref 1.8–8.0)
BASOPHILS: 0 % (ref 0–1)
EOSINOPHILS: 1 % (ref 0–7)
HCT: 41.7 % (ref 35.0–47.0)
HGB: 13.7 g/dL (ref 11.5–16.0)
LYMPHOCYTES: 16 % (ref 12–49)
MCH: 28.6 PG (ref 26.0–34.0)
MCHC: 32.9 g/dL (ref 30.0–36.5)
MCV: 87.1 FL (ref 80.0–99.0)
MONOCYTES: 8 % (ref 5–13)
NEUTROPHILS: 75 % (ref 32–75)
PLATELET: 216 10*3/uL (ref 150–400)
RBC: 4.79 M/uL (ref 3.80–5.20)
RDW: 13 % (ref 11.5–14.5)
WBC: 6.2 10*3/uL (ref 3.6–11.0)

## 2014-08-29 LAB — D DIMER: D-dimer: 1.68 mg/L FEU — ABNORMAL HIGH (ref 0.00–0.65)

## 2014-08-29 LAB — POC TROPONIN-I: Troponin-I (POC): <0.04 ng/mL (ref 0.00–0.08)

## 2014-08-29 MED ORDER — IBUPROFEN 600 MG TAB
600 mg | ORAL | Status: AC
Start: 2014-08-29 — End: 2014-08-29
  Administered 2014-08-29: 15:00:00 via ORAL

## 2014-08-29 MED ORDER — SODIUM CHLORIDE 0.9% BOLUS IV
0.9 % | Freq: Once | INTRAVENOUS | Status: AC
Start: 2014-08-29 — End: 2014-08-29
  Administered 2014-08-29: 15:00:00 via INTRAVENOUS

## 2014-08-29 MED ORDER — IOPAMIDOL 76 % IV SOLN
370 mg iodine /mL (76 %) | Freq: Once | INTRAVENOUS | Status: AC
Start: 2014-08-29 — End: 2014-08-29
  Administered 2014-08-29: 16:00:00 via INTRAVENOUS

## 2014-08-29 MED FILL — IBUPROFEN 600 MG TAB: 600 mg | ORAL | Qty: 1

## 2014-08-29 MED FILL — ISOVUE-370  76 % INTRAVENOUS SOLUTION: 370 mg iodine /mL (76 %) | INTRAVENOUS | Qty: 100

## 2014-08-29 MED FILL — SODIUM CHLORIDE 0.9 % IV: INTRAVENOUS | Qty: 1000

## 2014-08-29 NOTE — ED Notes (Signed)
Patient ambulatory to ED treatment area with complaint of "I fell about three weeks ago when I was at exercise class and landed on my right side." Patient says "I have pain on my right side from my breast to my upper stomach from the fall." Patient says she did not seek out medical attention after the fall because "I took some tylenol and the pain seemed to go away." Patient describes the pain as "burning or aching." Patient says it is sometimes hard to take in a deep breath."

## 2014-08-29 NOTE — ED Notes (Signed)
Bedside and Verbal shift change report given to Barbette Reichmann RN  (oncoming nurse) by Lexine Baton RN  (offgoing nurse). Report included the following information SBAR, Kardex, ED Summary and MAR.

## 2014-08-29 NOTE — ED Provider Notes (Signed)
HPI Comments: 80YO F presents with rib pain. Pt attributes this to fall 3 weeks ago. However, pain did not start until 2 weeks ago when developed constant pleuritic lateral R chest pain that is also worse when she moves or walks. She has taken tylenol with partial improvement.    No SOB, NV, diaphoresis, cough, leg swelling, long trips, recent surgeries, lightheadedness, HA, neck pain.        Patient is a 80 y.o. female presenting with rib pain.   Rib Pain         Past Medical History:   Diagnosis Date   ??? Hypertension    ??? Cancer ([place])      bladder, skin cancer   ??? Macular degeneration, bilateral    ??? Varicose veins of both legs with pain        Past Surgical History:   Procedure Laterality Date   ??? Complete urostomy care     ??? Hx urological       urostomy   ??? Pr abdomen surgery proc unlisted       tummy tuck   ??? Hx hysterectomy           History reviewed. No pertinent family history.    History     Social History   ??? Marital Status: MARRIED     Spouse Name: N/A     Number of Children: N/A   ??? Years of Education: N/A     Occupational History   ??? Not on file.     Social History Main Topics   ??? Smoking status: Former Smoker   ??? Smokeless tobacco: Never Used   ??? Alcohol Use: No   ??? Drug Use: No   ??? Sexual Activity: Not on file     Other Topics Concern   ??? Not on file     Social History Narrative                ALLERGIES: Latex      Review of Systems   All other systems reviewed and are negative.      Filed Vitals:    08/29/14 0917   BP: 132/69   Pulse: 66   Temp: 98.5 ??F (36.9 ??C)   Resp: 18   Height: 5\' 4"  (1.626 m)   Weight: 63.504 kg (140 lb)   SpO2: 98%            Physical Exam   Nursing note and vitals reviewed.  Constitutional: appears well-developed and well-nourished. No distress.   HENT:   Head: Normocephalic and atraumatic. Sclera anicteric  Nose: No rhinorrhea  Mouth/Throat: Oropharynx is clear and moist. Pharynx normal  Eyes: Conjunctivae are normal. Pupils are equal, round, and reactive to  light. Right eye exhibits no discharge. Left eye exhibits no discharge. No scleral icterus.   Neck: Painless normal range of motion. Supple. No ML Csp ttp  Cardiovascular: Normal rate, regular rhythm, normal heart sounds and intact distal pulses.  Exam reveals no gallop and no friction rub.  No murmur heard.  Pulmonary/Chest: Effort normal and breath sounds normal. No respiratory distress. no wheezes. no rales. +ttp over R inferior lateral chest wall (along midaxillary line)  Abdominal: Soft. Exhibits no distension and no mass. No tenderness. No guarding.   Musculoskeletal: Normal range of motion. no tenderness. No edema  Lymphadenopathy:   No cervical adenopathy.   Neurological:  Alert and oriented to person, place, and time. Coordination normal. CN 3-12 intact.  Moving all extremities.  Skin: Skin is warm and dry. No rash noted. No pallor.         MDM  Number of Diagnoses or Management Options  Acute chest pain:   Diagnosis management comments: R lateral reproducible chest pain x 2 weeks. Pt had fall but she estimates pain did not start for ~1 week after fall. She has ttp over chest wall consider contusion vs fracture. No ascult findings to suggest ptx. With pleuritic pain also consider PE. No infectious symptoms to suggest pna.       Procedures                           +d dimer, will obtain CTA    Pt feels better after ibuprofen. RSRPs

## 2014-08-29 NOTE — ED Notes (Signed)
Pt given discharge instructions by Dr Ivor Costa, pt verbalizes an understanding, pt stable at time of discharge ambulates to lobby

## 2014-08-29 NOTE — ED Notes (Signed)
Patient refusing ibuprofen stating "I prefer to take tylenol." Will make MD aware.

## 2014-08-29 NOTE — ED Notes (Signed)
After discussing benefits to taking ibuprofen instead of tylenol, patient agreed to try the ibuprofen. MD made aware.

## 2015-05-02 ENCOUNTER — Inpatient Hospital Stay
Admit: 2015-05-02 | Discharge: 2015-05-02 | Disposition: A | Discharge status: Home / Self Care | Payer: MEDICARE | Attending: Emergency Medicine

## 2015-05-02 ENCOUNTER — Emergency Department: Admit: 2015-05-02 | Payer: MEDICARE | Primary: Family Medicine

## 2015-05-02 DIAGNOSIS — M2662 Arthralgia of temporomandibular joint: Secondary | ICD-10-CM

## 2015-05-02 MED ORDER — DICLOFENAC 50 MG TAB, DELAYED RELEASE
50 mg | ORAL_TABLET | Freq: Two times a day (BID) | ORAL | 0 refills | Status: AC
Start: 2015-05-02 — End: ?

## 2015-05-02 NOTE — ED Notes (Signed)
The patient was discharged home by Dr. Lovena Le and Almyra Free, RN in stable condition. The patient is alert and oriented, is in no respiratory distress. The patient's diagnosis, condition and treatment were explained to patient. The patient/responsible party expressed understanding. 1 prescriptions given to pt. No work/school note given to pt. A discharge plan has been developed. A case manager was not involved in the process. Aftercare instructions were given to the patient.

## 2015-05-02 NOTE — ED Triage Notes (Signed)
Pt reports she began with right upper jaw pain about a week ago with limited ability to "open my mouth". The pain subsided towards end of last week and patient was able to take in food, yesterday pain in jaw began to increase again and patient reports pain increases when pressure is applied to area of jaw. Pt denies any other symptoms at this time or trauma to area.

## 2015-05-02 NOTE — ED Provider Notes (Signed)
HPI Comments: The patient has a one week history of pain in the right TMJ. She denies injury, facial swelling, fever or, toothache or other complaints. She had a tooth filled 2 weeks ago by her dentist, but says the symptoms did not start until a week after that.    Patient is a 81 y.o. female presenting with jaw pain.   Jaw Pain    Pertinent negatives include no vomiting.        Past Medical History:   Diagnosis Date   ??? Cancer ([place])      bladder, skin cancer   ??? Hypertension    ??? Macular degeneration, bilateral    ??? Varicose veins of both legs with pain        Past Surgical History:   Procedure Laterality Date   ??? Complete urostomy care     ??? Hx urological       urostomy   ??? Pr abdomen surgery proc unlisted       tummy tuck   ??? Hx hysterectomy           History reviewed. No pertinent family history.    Social History     Social History   ??? Marital status: WIDOWED     Spouse name: N/A   ??? Number of children: N/A   ??? Years of education: N/A     Occupational History   ??? Not on file.     Social History Main Topics   ??? Smoking status: Former Smoker   ??? Smokeless tobacco: Never Used   ??? Alcohol use No   ??? Drug use: No   ??? Sexual activity: Not on file     Other Topics Concern   ??? Not on file     Social History Narrative         ALLERGIES: Latex    Review of Systems   Constitutional: Negative for fever.   HENT: Negative for dental problem, ear pain, facial swelling, hearing loss and sore throat.    Eyes: Negative for visual disturbance.   Respiratory: Negative for cough, shortness of breath and wheezing.    Cardiovascular: Negative for chest pain.   Gastrointestinal: Negative for diarrhea, nausea and vomiting.   Musculoskeletal: Negative.  Negative for back pain and neck stiffness.   Skin: Negative for rash.   Neurological: Negative.  Negative for headaches.       Vitals:    05/02/15 1039   BP: 145/65   Pulse: 62   Resp: 16   Temp: 98 ??F (36.7 ??C)   SpO2: 96%   Weight: 65.8 kg (145 lb)   Height: 5\' 3"  (1.6 m)             Physical Exam   Constitutional: She appears well-developed and well-nourished. No distress.   HENT:   Head: Normocephalic.   There is mild tenderness over the right TMJ. There is no redness or swelling. No facial swelling is noted. Intraoral exam is unremarkable with no areas of swelling or tenderness noted. The pharynx is normal. The back is normal with no adenopathy. The right TM is normal.   Eyes: Pupils are equal, round, and reactive to light.   Neck: Normal range of motion.   Cardiovascular: Normal rate and regular rhythm.    Pulmonary/Chest: Effort normal. No respiratory distress.   Musculoskeletal: Normal range of motion.   Lymphadenopathy:     She has no cervical adenopathy.   Neurological: She is alert. She has normal strength.  No cranial nerve deficit.   Skin: Skin is warm and dry.   Nursing note and vitals reviewed.       MDM  ED Course       Procedures

## 2015-12-24 ENCOUNTER — Inpatient Hospital Stay: Admit: 2015-12-24 | Payer: MEDICARE | Attending: Urology | Primary: Family Medicine

## 2015-12-24 ENCOUNTER — Encounter

## 2015-12-24 DIAGNOSIS — Z8551 Personal history of malignant neoplasm of bladder: Secondary | ICD-10-CM

## 2016-08-19 ENCOUNTER — Emergency Department: Payer: Medicare Other

## 2016-08-19 ENCOUNTER — Encounter: Payer: Self-pay | Admitting: Emergency Medicine

## 2016-08-19 ENCOUNTER — Observation Stay: Payer: Medicare Other

## 2016-08-19 ENCOUNTER — Inpatient Hospital Stay
Admission: EM | Admit: 2016-08-19 | Discharge: 2016-08-21 | DRG: 300 | Disposition: A | Discharge status: Home / Self Care | Payer: Medicare Other | Attending: Internal Medicine | Admitting: Internal Medicine

## 2016-08-19 DIAGNOSIS — R410 Disorientation, unspecified: Secondary | ICD-10-CM

## 2016-08-19 DIAGNOSIS — Z936 Other artificial openings of urinary tract status: Secondary | ICD-10-CM

## 2016-08-19 DIAGNOSIS — E860 Dehydration: Secondary | ICD-10-CM | POA: Diagnosis present

## 2016-08-19 DIAGNOSIS — R609 Edema, unspecified: Secondary | ICD-10-CM | POA: Diagnosis not present

## 2016-08-19 DIAGNOSIS — R55 Syncope and collapse: Secondary | ICD-10-CM

## 2016-08-19 DIAGNOSIS — R7989 Other specified abnormal findings of blood chemistry: Secondary | ICD-10-CM

## 2016-08-19 DIAGNOSIS — Z87891 Personal history of nicotine dependence: Secondary | ICD-10-CM

## 2016-08-19 DIAGNOSIS — M7989 Other specified soft tissue disorders: Secondary | ICD-10-CM

## 2016-08-19 DIAGNOSIS — R778 Other specified abnormalities of plasma proteins: Secondary | ICD-10-CM | POA: Diagnosis present

## 2016-08-19 DIAGNOSIS — Z66 Do not resuscitate: Secondary | ICD-10-CM | POA: Diagnosis present

## 2016-08-19 DIAGNOSIS — I82412 Acute embolism and thrombosis of left femoral vein: Secondary | ICD-10-CM | POA: Diagnosis not present

## 2016-08-19 DIAGNOSIS — Z8551 Personal history of malignant neoplasm of bladder: Secondary | ICD-10-CM

## 2016-08-19 DIAGNOSIS — Z90710 Acquired absence of both cervix and uterus: Secondary | ICD-10-CM

## 2016-08-19 DIAGNOSIS — I248 Other forms of acute ischemic heart disease: Secondary | ICD-10-CM | POA: Diagnosis present

## 2016-08-19 DIAGNOSIS — N179 Acute kidney failure, unspecified: Secondary | ICD-10-CM | POA: Diagnosis present

## 2016-08-19 DIAGNOSIS — I10 Essential (primary) hypertension: Secondary | ICD-10-CM | POA: Diagnosis present

## 2016-08-19 DIAGNOSIS — N390 Urinary tract infection, site not specified: Secondary | ICD-10-CM | POA: Diagnosis present

## 2016-08-19 DIAGNOSIS — O2230 Deep phlebothrombosis in pregnancy, unspecified trimester: Secondary | ICD-10-CM | POA: Diagnosis present

## 2016-08-19 DIAGNOSIS — M316 Other giant cell arteritis: Secondary | ICD-10-CM | POA: Diagnosis present

## 2016-08-19 DIAGNOSIS — R0989 Other specified symptoms and signs involving the circulatory and respiratory systems: Secondary | ICD-10-CM

## 2016-08-19 HISTORY — DX: Malignant neoplasm of colon, unspecified: C18.9

## 2016-08-19 HISTORY — DX: Aneurysm of other specified arteries: I72.8

## 2016-08-19 HISTORY — DX: Essential (primary) hypertension: I10

## 2016-08-19 LAB — CBC WITH DIFFERENTIAL/PLATELET
Basophils Absolute: 0.1 10*3/uL (ref 0–0.1)
Basophils Relative: 0 %
EOS ABS: 0 10*3/uL (ref 0–0.7)
EOS PCT: 0 %
HCT: 40.9 % (ref 35.0–47.0)
HEMOGLOBIN: 13.7 g/dL (ref 12.0–16.0)
LYMPHS ABS: 0.4 10*3/uL — AB (ref 1.0–3.6)
Lymphocytes Relative: 3 %
MCH: 27.2 pg (ref 26.0–34.0)
MCHC: 33.6 g/dL (ref 32.0–36.0)
MCV: 81 fL (ref 80.0–100.0)
MONO ABS: 0.4 10*3/uL (ref 0.2–0.9)
MONOS PCT: 3 %
NEUTROS PCT: 94 %
Neutro Abs: 12.5 10*3/uL — ABNORMAL HIGH (ref 1.4–6.5)
Platelets: 125 10*3/uL — ABNORMAL LOW (ref 150–440)
RBC: 5.05 MIL/uL (ref 3.80–5.20)
RDW: 22.2 % — AB (ref 11.5–14.5)
WBC: 13.4 10*3/uL — ABNORMAL HIGH (ref 3.6–11.0)

## 2016-08-19 LAB — COMPREHENSIVE METABOLIC PANEL
ALBUMIN: 3 g/dL — AB (ref 3.5–5.0)
ALK PHOS: 68 U/L (ref 38–126)
ALT: 23 U/L (ref 14–54)
AST: 20 U/L (ref 15–41)
Anion gap: 10 (ref 5–15)
BUN: 36 mg/dL — AB (ref 6–20)
CALCIUM: 8.7 mg/dL — AB (ref 8.9–10.3)
CO2: 28 mmol/L (ref 22–32)
CREATININE: 1.09 mg/dL — AB (ref 0.44–1.00)
Chloride: 101 mmol/L (ref 101–111)
GFR calc Af Amer: 54 mL/min — ABNORMAL LOW (ref >60)
GFR calc non Af Amer: 47 mL/min — ABNORMAL LOW (ref >60)
GLUCOSE: 140 mg/dL — AB (ref 65–99)
Potassium: 4 mmol/L (ref 3.5–5.1)
SODIUM: 139 mmol/L (ref 135–145)
Total Bilirubin: 0.9 mg/dL (ref 0.3–1.2)
Total Protein: 6.3 g/dL — ABNORMAL LOW (ref 6.5–8.1)

## 2016-08-19 LAB — URINALYSIS, COMPLETE (UACMP) WITH MICROSCOPIC
Bilirubin Urine: NEGATIVE
Glucose, UA: NEGATIVE mg/dL
KETONES UR: NEGATIVE mg/dL
Nitrite: POSITIVE — AB
Protein, ur: 100 mg/dL — AB
SPECIFIC GRAVITY, URINE: 1.015 (ref 1.005–1.030)
pH: 8 (ref 5.0–8.0)

## 2016-08-19 LAB — TROPONIN I
TROPONIN I: 0.05 ng/mL — AB (ref <0.03)
TROPONIN I: 0.06 ng/mL — AB (ref <0.03)
TROPONIN I: 0.06 ng/mL — AB (ref <0.03)
TROPONIN I: 0.06 ng/mL — AB (ref <0.03)

## 2016-08-19 LAB — GLUCOSE, CAPILLARY: GLUCOSE-CAPILLARY: 213 mg/dL — AB (ref 65–99)

## 2016-08-19 MED ORDER — LOSARTAN POTASSIUM-HCTZ 100-12.5 MG PO TABS: 1.0000 | ORAL_TABLET | Freq: Every day | ORAL | Status: DC

## 2016-08-19 MED ORDER — CEFTRIAXONE SODIUM-DEXTROSE 1-3.74 GM-% IV SOLR
INTRAVENOUS | Status: AC
  Administered 2016-08-19: 1 g via INTRAVENOUS
  Filled 2016-08-19: qty 50

## 2016-08-19 MED ORDER — POLYETHYLENE GLYCOL 3350 17 G PO PACK
17.0000 g | PACK | Freq: Every day | ORAL | Status: DC | PRN
  Filled 2016-08-19: qty 1

## 2016-08-19 MED ORDER — PREDNISONE 20 MG PO TABS
20.0000 mg | ORAL_TABLET | Freq: Every day | ORAL | Status: DC
  Administered 2016-08-20: 20 mg via ORAL
  Filled 2016-08-19 (×2): qty 1

## 2016-08-19 MED ORDER — SODIUM CHLORIDE 0.9% FLUSH: 3.0000 mL | Freq: Two times a day (BID) | INTRAVENOUS | Status: DC

## 2016-08-19 MED ORDER — ONDANSETRON HCL 4 MG PO TABS: 4.0000 mg | ORAL_TABLET | Freq: Four times a day (QID) | ORAL | Status: DC | PRN

## 2016-08-19 MED ORDER — VITAMIN D3 25 MCG (1000 UNIT) PO TABS
2000.0000 [IU] | ORAL_TABLET | Freq: Every day | ORAL | Status: DC
  Administered 2016-08-19 – 2016-08-21 (×3): 2000 [IU] via ORAL
  Filled 2016-08-19 (×6): qty 2

## 2016-08-19 MED ORDER — BUSPIRONE HCL 10 MG PO TABS
5.0000 mg | ORAL_TABLET | Freq: Two times a day (BID) | ORAL | Status: DC
  Administered 2016-08-19 – 2016-08-21 (×4): 5 mg via ORAL
  Filled 2016-08-19: qty 1
  Filled 2016-08-19: qty 0.5
  Filled 2016-08-19 (×3): qty 1

## 2016-08-19 MED ORDER — PROSIGHT PO TABS
1.0000 | ORAL_TABLET | Freq: Every day | ORAL | Status: DC
  Filled 2016-08-19: qty 1

## 2016-08-19 MED ORDER — SERTRALINE HCL 50 MG PO TABS
25.0000 mg | ORAL_TABLET | Freq: Every day | ORAL | Status: DC
  Administered 2016-08-19 – 2016-08-21 (×3): 25 mg via ORAL
  Filled 2016-08-19: qty 1
  Filled 2016-08-19: qty 2
  Filled 2016-08-19: qty 1

## 2016-08-19 MED ORDER — ACETAMINOPHEN 325 MG PO TABS: 650.0000 mg | ORAL_TABLET | Freq: Four times a day (QID) | ORAL | Status: DC | PRN

## 2016-08-19 MED ORDER — PANTOPRAZOLE SODIUM 40 MG PO TBEC
40.0000 mg | DELAYED_RELEASE_TABLET | Freq: Every day | ORAL | Status: DC
  Administered 2016-08-19 – 2016-08-21 (×3): 40 mg via ORAL
  Filled 2016-08-19 (×3): qty 1

## 2016-08-19 MED ORDER — PRESERVISION AREDS 2 PO CAPS: ORAL_CAPSULE | Freq: Every day | ORAL | Status: DC

## 2016-08-19 MED ORDER — SODIUM CHLORIDE 0.9 % IV BOLUS (SEPSIS)
500.0000 mL | Freq: Once | INTRAVENOUS | Status: AC
  Administered 2016-08-19: 500 mL via INTRAVENOUS

## 2016-08-19 MED ORDER — PREDNISONE 20 MG PO TABS: 20.0000 mg | ORAL_TABLET | Freq: Every day | ORAL | Status: DC

## 2016-08-19 MED ORDER — TRAMADOL HCL 50 MG PO TABS
25.0000 mg | ORAL_TABLET | Freq: Every day | ORAL | Status: DC
  Administered 2016-08-19 – 2016-08-20 (×2): 25 mg via ORAL
  Filled 2016-08-19 (×2): qty 1

## 2016-08-19 MED ORDER — AMLODIPINE BESYLATE 5 MG PO TABS
5.0000 mg | ORAL_TABLET | Freq: Every day | ORAL | Status: DC
  Administered 2016-08-19 – 2016-08-21 (×3): 5 mg via ORAL
  Filled 2016-08-19 (×3): qty 1

## 2016-08-19 MED ORDER — APIXABAN 5 MG PO TABS: 5.0000 mg | ORAL_TABLET | Freq: Two times a day (BID) | ORAL | Status: DC

## 2016-08-19 MED ORDER — TRAMADOL HCL 50 MG PO TABS: 50.0000 mg | ORAL_TABLET | Freq: Four times a day (QID) | ORAL | Status: DC

## 2016-08-19 MED ORDER — LOSARTAN POTASSIUM 50 MG PO TABS
100.0000 mg | ORAL_TABLET | Freq: Every day | ORAL | Status: DC
  Administered 2016-08-20 – 2016-08-21 (×2): 100 mg via ORAL
  Filled 2016-08-19 (×2): qty 2

## 2016-08-19 MED ORDER — HYDROCODONE-ACETAMINOPHEN 5-325 MG PO TABS
1.0000 | ORAL_TABLET | ORAL | Status: DC | PRN
  Administered 2016-08-19: 2 via ORAL
  Filled 2016-08-19: qty 2

## 2016-08-19 MED ORDER — CEFTRIAXONE SODIUM-DEXTROSE 1-3.74 GM-% IV SOLR
1.0000 g | Freq: Once | INTRAVENOUS | Status: AC
  Administered 2016-08-19: 1 g via INTRAVENOUS

## 2016-08-19 MED ORDER — ENOXAPARIN SODIUM 40 MG/0.4ML ~~LOC~~ SOLN: 40.0000 mg | SUBCUTANEOUS | Status: DC

## 2016-08-19 MED ORDER — PREDNISONE 20 MG PO TABS
20.0000 mg | ORAL_TABLET | Freq: Two times a day (BID) | ORAL | Status: DC
  Administered 2016-08-19 – 2016-08-20 (×2): 20 mg via ORAL
  Filled 2016-08-19: qty 1

## 2016-08-19 MED ORDER — SODIUM CHLORIDE 0.9 % IV SOLN
INTRAVENOUS | Status: DC
  Administered 2016-08-19 – 2016-08-20 (×2): via INTRAVENOUS

## 2016-08-19 MED ORDER — LOSARTAN POTASSIUM 50 MG PO TABS: 50.0000 mg | ORAL_TABLET | Freq: Two times a day (BID) | ORAL | Status: DC

## 2016-08-19 MED ORDER — DEXTROSE 5 % IV SOLN: 1.0000 g | Freq: Once | INTRAVENOUS | Status: DC

## 2016-08-19 MED ORDER — APIXABAN 5 MG PO TABS
10.0000 mg | ORAL_TABLET | Freq: Two times a day (BID) | ORAL | Status: DC
  Administered 2016-08-19 – 2016-08-21 (×4): 10 mg via ORAL
  Filled 2016-08-19 (×5): qty 2

## 2016-08-19 MED ORDER — ACETAMINOPHEN 650 MG RE SUPP: 650.0000 mg | Freq: Four times a day (QID) | RECTAL | Status: DC | PRN

## 2016-08-19 MED ORDER — HYDROCHLOROTHIAZIDE 12.5 MG PO CAPS
12.5000 mg | ORAL_CAPSULE | Freq: Every day | ORAL | Status: DC
  Administered 2016-08-20 – 2016-08-21 (×2): 12.5 mg via ORAL
  Filled 2016-08-19 (×2): qty 1

## 2016-08-19 MED ORDER — ONDANSETRON HCL 4 MG/2ML IJ SOLN: 4.0000 mg | Freq: Four times a day (QID) | INTRAMUSCULAR | Status: DC | PRN

## 2016-08-19 MED ORDER — OCUVITE-LUTEIN PO CAPS
1.0000 | ORAL_CAPSULE | Freq: Every day | ORAL | Status: DC
  Administered 2016-08-19 – 2016-08-21 (×3): 1 via ORAL
  Filled 2016-08-19 (×3): qty 1

## 2016-08-19 NOTE — Progress Notes (Signed)
Lower extremity Doppler is positive for DVT. I discussed side effects, alternatives, risks and benefits of anticoagulation with patient and daughter at bedside. They have accepted risk and patient will start on anticoagulation with Eliquis

## 2016-08-19 NOTE — H&P (Addendum)
[place] [facility] [place] NAME: Yvonne Smith    MR#:  [id number]  DATE OF BIRTH:  [birth year]  DATE OF ADMISSION:  08/19/2016  PRIMARY CARE PHYSICIAN: patient from [place]   REQUESTING/REFERRING PHYSICIAN: sr Marcelene Butte  CHIEF COMPLAINT:   Staring spell HISTORY OF PRESENT ILLNESS:  Yvonne Smith  is a 82 y.o. female with a known history of HTN And temporal arteritis currently on prednisone who presents with above complaint. Daughter reports that patient has been in her usual state of health and is visiting from [place] when this morning after getting out of bed she found her mother leaning on the wall and just staring. She was not responsive. There is no loss of consciousness. She did not hit her head and she had no falls. It appears that she was just in the days. By the time EMS arrived she was back to her normal state of health. There is no seizure activity. Patient has a urostomy bag but has no foul-smelling urine or fevers  PAST MEDICAL HISTORY:   Past Medical History:  Diagnosis Date  . Aneurysm of superficial temporal artery (HCC)   . bladder cancer   . Hypertension     PAST SURGICAL HISTORY:   Past Surgical History:  Procedure Laterality Date  . ABDOMINAL HYSTERECTOMY    . ABDOMINAL SURGERY    . TEMPORAL ARTERY BIOPSY / LIGATION      SOCIAL HISTORY:   Social History  Substance Use Topics  . Smoking status: Former Research scientist (life sciences)  . Smokeless tobacco: Never Used  . Alcohol use No    FAMILY HISTORY:  No history of CAD  DRUG ALLERGIES:  No known drug allergies  REVIEW OF SYSTEMS:   Review of Systems  Constitutional: Negative.  Negative for chills, fever and malaise/fatigue.  HENT: Negative.  Negative for ear discharge, ear pain, hearing loss, nosebleeds and sore throat.   Eyes: Negative.  Negative for blurred vision and pain.  Respiratory: Negative.  Negative for cough, hemoptysis, shortness of breath and wheezing.    Cardiovascular: Negative.  Negative for chest pain, palpitations and leg swelling.  Gastrointestinal: Negative.  Negative for abdominal pain, blood in stool, diarrhea, nausea and vomiting.  Genitourinary: Negative.  Negative for dysuria.  Musculoskeletal: Negative.  Negative for back pain.  Skin: Negative.   Neurological: Negative for dizziness, tremors, speech change, focal weakness, seizures and headaches.       Staring spell  Endo/Heme/Allergies: Negative.  Does not bruise/bleed easily.  Psychiatric/Behavioral: Negative.  Negative for depression, hallucinations and suicidal ideas.    MEDICATIONS AT HOME:   Prior to Admission medications   Medication Sig Start Date End Date Taking? Authorizing Provider  losartan (COZAAR) 50 MG tablet Take 50 mg by mouth every 12 (twelve) hours. 06/10/16   Historical Provider, MD  traMADol (ULTRAM) 50 MG tablet Take 1 tablet by mouth every 6 (six) hours. 08/08/16   Historical Provider, MD    Prednisone 20 mg twice a day 2 tablets in the morning and one at night  tramadol 25 mg at bedtime Hyzaar 100/12.5 daily Norvasc 5 mg daily Prilosec 40 mg daily Zoloft 25 mg daily Vitamin D 2000 units daily  BuSpar 5 mg twice a day  VITAL SIGNS:  Blood pressure (!) 143/56, pulse 95, temperature 97.5 F (36.4 C), temperature source Oral, resp. rate 18, height 5\' 4"  (1.626 m), weight 59 kg (130 lb), SpO2 95 %.  PHYSICAL EXAMINATION:   Physical Exam  Constitutional: She  is oriented to person, place, and time and well-developed, well-nourished, and in no distress. No distress.  HENT:  Head: Normocephalic.  Eyes: No scleral icterus.  Neck: Normal range of motion. Neck supple. No JVD present. No tracheal deviation present.  Right carotid bruit  Cardiovascular: Normal rate, regular rhythm and normal heart sounds.  Exam reveals no gallop and no friction rub.   No murmur heard. Pulmonary/Chest: Effort normal and breath sounds normal. No respiratory distress. She  has no wheezes. She has no rales. She exhibits no tenderness.  Abdominal: Soft. Bowel sounds are normal. She exhibits no distension and no mass. There is no tenderness. There is no rebound and no guarding.  Urostomy bag  Musculoskeletal: Normal range of motion. She exhibits edema (lle).  Neurological: She is alert and oriented to person, place, and time.  Skin: Skin is warm. No rash noted. No erythema.  Psychiatric: Affect and judgment normal.      LABORATORY PANEL:   CBC  Recent Labs Lab 08/19/16 0930  WBC 13.4*  HGB 13.7  HCT 40.9  PLT 125*   ------------------------------------------------------------------------------------------------------------------  Chemistries   Recent Labs Lab 08/19/16 0930  NA 139  K 4.0  CL 101  CO2 28  GLUCOSE 140*  BUN 36*  CREATININE 1.09*  CALCIUM 8.7*  AST 20  ALT 23  ALKPHOS 68  BILITOT 0.9   ------------------------------------------------------------------------------------------------------------------  Cardiac Enzymes  Recent Labs Lab 08/19/16 1232  TROPONINI 0.06*   ------------------------------------------------------------------------------------------------------------------  RADIOLOGY:  Ct Head Wo Contrast  Result Date: 08/19/2016 CLINICAL DATA:  Syncope this morning EXAM: CT HEAD WITHOUT CONTRAST TECHNIQUE: Contiguous axial images were obtained from the base of the skull through the vertex without intravenous contrast. COMPARISON:  None. FINDINGS: Brain: No intracranial hemorrhage, mass effect or midline shift. No acute cortical infarction. No mass lesion is noted on this unenhanced scan. Mild cerebral atrophy. Moderate periventricular and patchy subcortical white matter decreased attenuation probable due to chronic small vessel ischemic changes. Vascular: Minimal atherosclerotic calcifications of carotid siphon. Skull: No skull fracture. Sinuses/Orbits: No acute findings Other: None IMPRESSION: No acute  intracranial abnormality. Mild cerebral atrophy. Moderate periventricular and patchy subcortical white matter decreased attenuation probable due to chronic small vessel ischemic changes. No acute cortical infarction. Electronically Signed   By: Lahoma Crocker M.D.   On: 08/19/2016 09:57    EKG:   Sinus rhythm no ST elevation  IMPRESSION AND PLAN:   82 year old female visiting from [place] with history of hypertension who presents with a staring spell  1. Staring spell without loss of consciousness: Order carotid Doppler to evaluate for carotid artery stenosis. Check orthostatic vitals Continue neuro checks  2. Elevated troponin: This is due to demand ischemia from acute kidney injury  Continue telemetry Continue monitoring troponins.  3. Acute kidney injury due to mild dehydration: Continue IV fluids  4. Essential hypertension: Continue outpatient medications.  5. Temporal arteritis currently on prednisone which will be continued  6. Left lower extremity edema: Order Doppler to evaluate for DVT    All the records are reviewed and case discussed with ED provider. Management plans discussed with the patient and she is in agreement  CODE STATUS: DNR  TOTAL TIME TAKING CARE OF THIS PATIENT: 40 minutes.    Stanislaw Acton M.D on 08/19/2016 at 2:56 PM  Between 7am to 6pm - Pager - [phone]  After 6pm go to [email or web address] - password [email or web address] [facility]  Office  [phone]  CC: Primary care physician; No PCP  Per Patient

## 2016-08-19 NOTE — ED Notes (Signed)
Patient transported to CT 

## 2016-08-19 NOTE — Progress Notes (Signed)
Family Meeting Note  Advance Directive:yes  Today a meeting took place with the Patient.and daughter    The following clinical team members were present during this meeting:MD  The following were discussed:Patient's diagnosis: Staring spell history of temporal arteritis, Patient's progosis: Unable to determine and Goals for treatment: DNR  Additional follow-up to be provided: Family to bring in advanced rectus  Time spent during discussion 16 minutes  Santiago Stenzel, MD

## 2016-08-19 NOTE — ED Provider Notes (Signed)
Time Seen: Approximately 0855  I have reviewed the triage notes  Chief Complaint: Near Syncope   History of Present Illness: Yvonne Smith is a 82 y.o. female who presents with a near syncopal episode this morning. The patient apparently is under treatment for temporal arteritis on the right and is here living with her family now locally. She moved here from [place] and plans are for her eventually to return there for further treatment. The patient apparently was walking to the bathroom and she came generalized weakness and unresponsive to verbal stimuli for a short period of time. Her daughter describes her as "" staring off into space "" for approximately 5-10 minutes. No witnessed seizure activity and the patient seems to be back at her baseline at this time with short-term memory deficits.   Past Medical History:  Diagnosis Date  . Aneurysm of superficial temporal artery (HCC)   . bladder cancer   . Hypertension     Patient Active Problem List   Diagnosis Date Noted  . Elevated troponin 08/19/2016    Past Surgical History:  Procedure Laterality Date  . ABDOMINAL HYSTERECTOMY    . ABDOMINAL SURGERY    . TEMPORAL ARTERY BIOPSY / LIGATION      Past Surgical History:  Procedure Laterality Date  . ABDOMINAL HYSTERECTOMY    . ABDOMINAL SURGERY    . TEMPORAL ARTERY BIOPSY / LIGATION      Current Outpatient Rx  . Order #: [id number] Class: Historical Med  . Order #: [id number] Class: Historical Med  . Order #: [id number] Class: Historical Med  . Order #: [id number] Class: Historical Med  . Order #: [id number] Class: Historical Med  . Order #: [id number] Class: Historical Med  . Order #: [id number] Class: Historical Med  . Order #: [id number] Class: Historical Med  . Order #: [id number] Class: Historical Med    Allergies:  Patient has no allergy information on record.  Family History: No family history on file.  Social History: Social History  Substance Use Topics  . Smoking  status: Former Research scientist (life sciences)  . Smokeless tobacco: Never Used  . Alcohol use No     Review of Systems:   10 point review of systems was performed and was otherwise negative:  Constitutional: No fever Eyes: No visual disturbances ENT: No sore throat, ear pain Cardiac: No chest pain Respiratory: No shortness of breath, wheezing, or stridor Abdomen: No abdominal pain, no vomiting, No diarrhea Endocrine: No weight loss, No night sweats Extremities: No peripheral edema, cyanosis Skin: No rashes, easy bruising Neurologic: No focal weakness, trouble with speech or swollowing Urologic: No dysuria, Hematuria, or urinary frequency   Physical Exam:  ED Triage Vitals  Enc Vitals Group     BP 08/19/16 0901 140/74     Pulse Rate 08/19/16 0901 91     Resp 08/19/16 0901 19     Temp 08/19/16 0912 97.5 F (36.4 C)     Temp Source 08/19/16 0912 Oral     SpO2 08/19/16 0901 96 %     Weight 08/19/16 0913 130 lb (59 kg)     Height 08/19/16 0913 5\' 4"  (1.626 m)     Head Circumference --      Peak Flow --      Pain Score --      Pain Loc --      Pain Edu? --      Excl. in [place]? --     General: Awake , Alert , and Oriented times 2, Glasgow Coma Scale 15 the  patient's telemetry awake and cooperative Head: Normal cephalic , atraumatic. No significant tenderness with palpation across the temple regions Eyes: Pupils equal , round, reactive to light. Extraocular eye movements are intact and conjunctiva is clear Nose/Throat: No nasal drainage, patent upper airway without erythema or exudate.  Neck: Supple, Full range of motion, No anterior adenopathy or palpable thyroid masses Lungs: Clear to ascultation without wheezes , rhonchi, or rales Heart: Regular rate, regular rhythm without murmurs , gallops , or rubs Abdomen: Patient has a lower ostomy site. Previous surgery for bladder cancer Soft, non tender without rebound, guarding , or rigidity; bowel sounds positive and symmetric in all 4 quadrants. No  organomegaly .        Extremities: 2 plus symmetric pulses. No edema, clubbing or cyanosis Neurologic: normal ambulation, Motor symmetric without deficits, sensory intact Skin: warm, dry, no rashes   Labs:   All laboratory work was reviewed including any pertinent negatives or positives listed below:  Labs Reviewed  COMPREHENSIVE METABOLIC PANEL - Abnormal; Notable for the following:       Result Value   Glucose, Bld 140 (*)    BUN 36 (*)    Creatinine, Ser 1.09 (*)    Calcium 8.7 (*)    Total Protein 6.3 (*)    Albumin 3.0 (*)    GFR calc non Af Amer 47 (*)    GFR calc Af Amer 54 (*)    All other components within normal limits  CBC WITH DIFFERENTIAL/PLATELET - Abnormal; Notable for the following:    WBC 13.4 (*)    RDW 22.2 (*)    Platelets 125 (*)    Neutro Abs 12.5 (*)    Lymphs Abs 0.4 (*)    All other components within normal limits  TROPONIN I - Abnormal; Notable for the following:    Troponin I 0.05 (*)    All other components within normal limits  URINALYSIS, COMPLETE (UACMP) WITH MICROSCOPIC - Abnormal; Notable for the following:    Color, Urine YELLOW (*)    APPearance CLOUDY (*)    Hgb urine dipstick MODERATE (*)    Protein, ur 100 (*)    Nitrite POSITIVE (*)    Leukocytes, UA LARGE (*)    Bacteria, UA RARE (*)    Squamous Epithelial / LPF 0-5 (*)    All other components within normal limits  TROPONIN I - Abnormal; Notable for the following:    Troponin I 0.06 (*)    All other components within normal limits  URINE CULTURE  Patient has findings consistent with urinary tract infection. Urine was acquired by emptying out the colostomy bag and then re-collecting and the bag.  EKG: ED ECG REPORT I, Daymon Larsen, the attending physician, personally viewed and interpreted this ECG.  Date: 08/19/2016 EKG Time:0904 Rate: *94 Rhythm: normal sinus rhythm QRS Axis: normal Intervals: normal ST/T Wave abnormalities: normal Conduction Disturbances:  none Narrative Interpretation: unremarkable Left ventricular hypertrophy No acute ischemic changes   Radiology: * "Ct Head Wo Contrast  Result Date: 08/19/2016 CLINICAL DATA:  Syncope this morning EXAM: CT HEAD WITHOUT CONTRAST TECHNIQUE: Contiguous axial images were obtained from the base of the skull through the vertex without intravenous contrast. COMPARISON:  None. FINDINGS: Brain: No intracranial hemorrhage, mass effect or midline shift. No acute cortical infarction. No mass lesion is noted on this unenhanced scan. Mild cerebral atrophy. Moderate periventricular and patchy subcortical white matter decreased attenuation probable due to chronic small vessel ischemic changes. Vascular: Minimal  atherosclerotic calcifications of carotid siphon. Skull: No skull fracture. Sinuses/Orbits: No acute findings Other: None IMPRESSION: No acute intracranial abnormality. Mild cerebral atrophy. Moderate periventricular and patchy subcortical white matter decreased attenuation probable due to chronic small vessel ischemic changes. No acute cortical infarction. Electronically Signed   By: Lahoma Crocker M.D.   On: 08/19/2016 09:57  " I personally reviewed the radiologic studies   ED Course:  Patient's stay here was uneventful and the patient's otherwise hemodynamically stable. The patient had serial troponins which was slightly abnormal. Require further inpatient monitoring. She has a urine culture pending was started on IV antibiotic therapy. I felt was unlikely that she was septic at this time. Clinical Course      Assessment:  Near syncope Urinary tract infection Elevated troponins of unknown significance   Final Clinical Impression:   Final diagnoses:  Near syncope     Plan:  Inpatient            Daymon Larsen, MD 08/19/16 1523

## 2016-08-19 NOTE — Consult Note (Signed)
ANTICOAGULATION CONSULT NOTE - Initial Consult  Pharmacy Consult for apixaban Indication: DVT  Not on File  Patient Measurements: Height: 5\' 4"  (162.6 cm) Weight: 132 lb 1.6 oz (59.9 kg) IBW/kg (Calculated) : 54.7 Heparin Dosing Weight:   Vital Signs: Temp: 98.4 F (36.9 C) (12/12 1730) Temp Source: Oral (12/12 0912) BP: 124/67 (12/12 1730) Pulse Rate: 121 (12/12 1730)  Labs:  Recent Labs  08/19/16 0930 08/19/16 1232  HGB 13.7  --   HCT 40.9  --   PLT 125*  --   CREATININE 1.09*  --   TROPONINI 0.05* 0.06*    Estimated Creatinine Clearance: 35.5 mL/min (by C-G formula based on SCr of 1.09 mg/dL (H)).   Medical History: Past Medical History:  Diagnosis Date  . Aneurysm of superficial temporal artery (HCC)   . bladder cancer   . Hypertension     Medications:  Scheduled:  . amLODipine  5 mg Oral Daily  . apixaban  10 mg Oral BID   Followed by  . [START ON 08/26/2016] apixaban  5 mg Oral BID  . busPIRone  5 mg Oral BID  . cholecalciferol  2,000 Units Oral Daily  . [START ON 08/20/2016] losartan  100 mg Oral Daily   And  . [START ON 08/20/2016] hydrochlorothiazide  12.5 mg Oral Daily  . multivitamin-lutein  1 capsule Oral Daily  . pantoprazole  40 mg Oral Daily  . [START ON 08/20/2016] predniSONE  20 mg Oral Q breakfast  . predniSONE  20 mg Oral BID WC  . sertraline  25 mg Oral Daily  . sodium chloride flush  3 mL Intravenous Q12H  . traMADol  50 mg Oral Q6H    Assessment: Pt is a 82 year old female found to have a lower extremity DVT. Pharmacy consulted to dose apixaban. Goal of Therapy:   Monitor platelets by anticoagulation protocol: Yes   Plan:  apixaban 10mg  BID x 7 days then 5mg  BID. CBC and Scr q 3 days while inpatient  Ramond Dial, Pharm.D, BCPS Clinical Pharmacist  08/19/2016,6:17 PM

## 2016-08-19 NOTE — ED Notes (Signed)
Unable to obtain sterile urine specimen due urostomy bag.  MD aware.

## 2016-08-19 NOTE — ED Notes (Signed)
Patient transported to Ultrasound 

## 2016-08-19 NOTE — ED Triage Notes (Signed)
Pt in via EMS from home; pt reports near syncope episode this morning, daughter states that while walking out of the bathroom, pt became very weak and pale, leaning up against the wall, family was able to get her to a seated position, daughter reports altered mental status at that time, pt unable to follow daughters commands was was in a "stare."  Daughter reports by the time EMS arrived she was more alert.  Pt A/Ox3, vitals WDL, no immediate distress at this time.

## 2016-08-19 NOTE — ED Notes (Signed)
Attempted to call report; RN in with new admit from [facility].  Name and ascom number left for RN to call me back.

## 2016-08-20 ENCOUNTER — Inpatient Hospital Stay (HOSPITAL_COMMUNITY)
Admit: 2016-08-20 | Discharge: 2016-08-20 | Disposition: A | Discharge status: Home / Self Care | Payer: Medicare Other | Attending: Neurology | Admitting: Neurology

## 2016-08-20 ENCOUNTER — Observation Stay: Payer: Medicare Other

## 2016-08-20 ENCOUNTER — Inpatient Hospital Stay: Payer: Medicare Other

## 2016-08-20 DIAGNOSIS — Z66 Do not resuscitate: Secondary | ICD-10-CM | POA: Diagnosis present

## 2016-08-20 DIAGNOSIS — I10 Essential (primary) hypertension: Secondary | ICD-10-CM | POA: Diagnosis present

## 2016-08-20 DIAGNOSIS — Z90710 Acquired absence of both cervix and uterus: Secondary | ICD-10-CM | POA: Diagnosis not present

## 2016-08-20 DIAGNOSIS — O2230 Deep phlebothrombosis in pregnancy, unspecified trimester: Secondary | ICD-10-CM | POA: Diagnosis present

## 2016-08-20 DIAGNOSIS — E860 Dehydration: Secondary | ICD-10-CM | POA: Diagnosis present

## 2016-08-20 DIAGNOSIS — R55 Syncope and collapse: Secondary | ICD-10-CM | POA: Diagnosis not present

## 2016-08-20 DIAGNOSIS — I639 Cerebral infarction, unspecified: Secondary | ICD-10-CM

## 2016-08-20 DIAGNOSIS — Z936 Other artificial openings of urinary tract status: Secondary | ICD-10-CM | POA: Diagnosis not present

## 2016-08-20 DIAGNOSIS — N390 Urinary tract infection, site not specified: Secondary | ICD-10-CM | POA: Diagnosis present

## 2016-08-20 DIAGNOSIS — N179 Acute kidney failure, unspecified: Secondary | ICD-10-CM | POA: Diagnosis present

## 2016-08-20 DIAGNOSIS — Z8551 Personal history of malignant neoplasm of bladder: Secondary | ICD-10-CM | POA: Diagnosis not present

## 2016-08-20 DIAGNOSIS — I82412 Acute embolism and thrombosis of left femoral vein: Secondary | ICD-10-CM | POA: Diagnosis present

## 2016-08-20 DIAGNOSIS — M316 Other giant cell arteritis: Secondary | ICD-10-CM | POA: Diagnosis present

## 2016-08-20 DIAGNOSIS — Z87891 Personal history of nicotine dependence: Secondary | ICD-10-CM | POA: Diagnosis not present

## 2016-08-20 DIAGNOSIS — R609 Edema, unspecified: Secondary | ICD-10-CM | POA: Diagnosis present

## 2016-08-20 DIAGNOSIS — I248 Other forms of acute ischemic heart disease: Secondary | ICD-10-CM | POA: Diagnosis present

## 2016-08-20 LAB — BASIC METABOLIC PANEL
ANION GAP: 7 (ref 5–15)
BUN: 39 mg/dL — ABNORMAL HIGH (ref 6–20)
CALCIUM: 7.8 mg/dL — AB (ref 8.9–10.3)
CHLORIDE: 105 mmol/L (ref 101–111)
CO2: 26 mmol/L (ref 22–32)
Creatinine, Ser: 1.02 mg/dL — ABNORMAL HIGH (ref 0.44–1.00)
GFR calc Af Amer: 59 mL/min — ABNORMAL LOW (ref >60)
GFR calc non Af Amer: 51 mL/min — ABNORMAL LOW (ref >60)
GLUCOSE: 246 mg/dL — AB (ref 65–99)
Potassium: 4.3 mmol/L (ref 3.5–5.1)
Sodium: 138 mmol/L (ref 135–145)

## 2016-08-20 LAB — GLUCOSE, CAPILLARY
GLUCOSE-CAPILLARY: 331 mg/dL — AB (ref 65–99)
Glucose-Capillary: 153 mg/dL — ABNORMAL HIGH (ref 65–99)
Glucose-Capillary: 272 mg/dL — ABNORMAL HIGH (ref 65–99)
Glucose-Capillary: 267 mg/dL — ABNORMAL HIGH (ref 65–99)

## 2016-08-20 LAB — URINE CULTURE

## 2016-08-20 LAB — TROPONIN I: TROPONIN I: 0.05 ng/mL — AB (ref <0.03)

## 2016-08-20 MED ORDER — PREDNISONE 20 MG PO TABS
40.0000 mg | ORAL_TABLET | Freq: Every day | ORAL | Status: DC
  Administered 2016-08-21: 40 mg via ORAL
  Filled 2016-08-20: qty 2

## 2016-08-20 MED ORDER — INSULIN ASPART 100 UNIT/ML ~~LOC~~ SOLN
0.0000 [IU] | Freq: Every day | SUBCUTANEOUS | Status: DC
  Administered 2016-08-20: 3 [IU] via SUBCUTANEOUS
  Filled 2016-08-20: qty 3

## 2016-08-20 MED ORDER — POLYETHYLENE GLYCOL 3350 17 G PO PACK
17.0000 g | PACK | Freq: Every day | ORAL | Status: DC
  Administered 2016-08-20 – 2016-08-21 (×2): 17 g via ORAL
  Filled 2016-08-20: qty 1

## 2016-08-20 MED ORDER — PREDNISONE 20 MG PO TABS
20.0000 mg | ORAL_TABLET | Freq: Every day | ORAL | Status: DC
  Administered 2016-08-20: 20 mg via ORAL
  Filled 2016-08-20: qty 1

## 2016-08-20 MED ORDER — INSULIN ASPART 100 UNIT/ML ~~LOC~~ SOLN
0.0000 [IU] | Freq: Three times a day (TID) | SUBCUTANEOUS | Status: DC
  Administered 2016-08-20: 7 [IU] via SUBCUTANEOUS
  Administered 2016-08-21: 3 [IU] via SUBCUTANEOUS
  Filled 2016-08-20: qty 3
  Filled 2016-08-20: qty 7

## 2016-08-20 NOTE — Consult Note (Signed)
Reason for Consult:Starring episode Referring Physician: Manuella Ghazi  CC: Starring episode  HPI: Yvonne Smith is an 82 y.o. female with a history of bladder cancer who presents after a staring episode.  Patient is unable to provide any history and family not present.  All history obtained from the chart.  Patient is visiting from [place] when yesterday morning after getting out of bed she found her mother leaning on the wall and just staring. She was not responsive. There is no loss of consciousness. She did not hit her head and she had no falls. It appears that she was just in a daze. By the time EMS arrived she was back to her normal state of health. There was no clonic activity.   Past Medical History:  Diagnosis Date  . Aneurysm of superficial temporal artery (HCC)   . bladder cancer   . Hypertension     Past Surgical History:  Procedure Laterality Date  . ABDOMINAL HYSTERECTOMY    . ABDOMINAL SURGERY    . TEMPORAL ARTERY BIOPSY / LIGATION      Family history: Patient unable to provide  Social History:  reports that she has quit smoking. She has never used smokeless tobacco. She reports that she does not drink alcohol or use drugs.  Allergies  Allergen Reactions  . Latex Hives    Medications:  I have reviewed the patient's current medications. Prior to Admission:  Prescriptions Prior to Admission  Medication Sig Dispense Refill Last Dose  . amLODipine (NORVASC) 5 MG tablet Take 5 mg by mouth daily.   08/18/2016 at 0730  . busPIRone (BUSPAR) 5 MG tablet Take 5 mg by mouth 2 (two) times daily.   Past Week at 35  . cholecalciferol (VITAMIN D) 1000 units tablet Take 2,000 Units by mouth daily.   08/18/2016 at 0730  . losartan-hydrochlorothiazide (HYZAAR) 100-12.5 MG tablet Take 1 tablet by mouth daily.   08/18/2016 at 0730  . Multiple Vitamins-Minerals (PRESERVISION AREDS 2 PO) Take 1 tablet by mouth daily.   08/18/2016 at 0730  . omeprazole (PRILOSEC) 40 MG capsule Take 40 mg  by mouth daily.   08/18/2016 at 0730  . predniSONE (DELTASONE) 20 MG tablet Take 20 mg by mouth 2 (two) times daily with a meal. 2 tablets in the morning and 1 at night   08/18/2016 at 1930  . sertraline (ZOLOFT) 25 MG tablet Take 25 mg by mouth daily.   08/18/2016 at 0730  . traMADol (ULTRAM) 50 MG tablet Take 25 mg by mouth at bedtime.   2 08/18/2016 at 1930   Scheduled: . amLODipine  5 mg Oral Daily  . apixaban  10 mg Oral BID   Followed by  . [START ON 08/26/2016] apixaban  5 mg Oral BID  . busPIRone  5 mg Oral BID  . cholecalciferol  2,000 Units Oral Daily  . losartan  100 mg Oral Daily   And  . hydrochlorothiazide  12.5 mg Oral Daily  . multivitamin-lutein  1 capsule Oral Daily  . pantoprazole  40 mg Oral Daily  . predniSONE  20 mg Oral Q supper  . [START ON 08/21/2016] predniSONE  40 mg Oral Q breakfast  . sertraline  25 mg Oral Daily  . sodium chloride flush  3 mL Intravenous Q12H  . traMADol  25 mg Oral QHS    ROS: Patient unable to provide  Physical Examination: Blood pressure (!) 111/51, pulse 87, temperature 99 F (37.2 C), temperature source Oral, resp. rate 18,  height 5\' 4"  (1.626 m), weight 59.9 kg (132 lb 1.6 oz), SpO2 93 %.  HEENT-  Normocephalic, no lesions, without obvious abnormality.  Normal external eye and conjunctiva.  Normal TM's bilaterally.  Normal auditory canals and external ears. Normal external nose, mucus membranes and septum.  Normal pharynx. Cardiovascular- S1, S2 normal, pulses palpable throughout   Lungs- chest clear, no wheezing, rales, normal symmetric air entry Abdomen- soft, non-tender; bowel sounds normal; no masses,  no organomegaly Extremities- no edema Lymph-no adenopathy palpable Musculoskeletal-left arm tenderness Skin-warm and dry, no hyperpigmentation, vitiligo, or suspicious lesions  Neurological Examination Mental Status: Alert.  Not oriented to place or year.  Does not know why she is in the hospital.  Does not know where  she lives.  Speech fluent without evidence of aphasia.  Able to follow 3 step commands without difficulty. Cranial Nerves: II: Discs flat bilaterally; Visual fields grossly normal, pupils equal, round, reactive to light and accommodation III,IV, VI: ptosis not present, extra-ocular motions intact bilaterally V,VII: smile symmetric, facial light touch sensation normal bilaterally VIII: hearing normal bilaterally IX,X: gag reflex present XI: bilateral shoulder shrug XII: midline tongue extension Motor: Right : Upper extremity   5/5    Left:     Upper extremity   4/5 limited by pain  Lower extremity   5/5     Lower extremity   5/5 Tone and bulk:normal tone throughout; no atrophy noted Sensory: Pinprick and light touch intact throughout, bilaterally Deep Tendon Reflexes: 1+ and symmetric with absent AJ's bilaterally Plantars: Right: upgoing   Left: upgoing Cerebellar: Normal finger-to-nose and normal heel-to-shin testing bilaterally Gait: not tested due to safety concerns   Laboratory Studies:   Basic Metabolic Panel:  Recent Labs Lab 08/19/16 0930 08/20/16 0320  NA 139 138  K 4.0 4.3  CL 101 105  CO2 28 26  GLUCOSE 140* 246*  BUN 36* 39*  CREATININE 1.09* 1.02*  CALCIUM 8.7* 7.8*    Liver Function Tests:  Recent Labs Lab 08/19/16 0930  AST 20  ALT 23  ALKPHOS 68  BILITOT 0.9  PROT 6.3*  ALBUMIN 3.0*   No results for input(s): LIPASE, AMYLASE in the last 168 hours. No results for input(s): AMMONIA in the last 168 hours.  CBC:  Recent Labs Lab 08/19/16 0930  WBC 13.4*  NEUTROABS 12.5*  HGB 13.7  HCT 40.9  MCV 81.0  PLT 125*    Cardiac Enzymes:  Recent Labs Lab 08/19/16 0930 08/19/16 1232 08/19/16 1733 08/19/16 2125 08/20/16 0320  TROPONINI 0.05* 0.06* 0.06* 0.06* 0.05*    BNP: Invalid input(s): POCBNP  CBG:  Recent Labs Lab 08/19/16 2243 08/20/16 0751 08/20/16 1200  GLUCAP 213* 153* 272*    Microbiology: No results found for this  or any previous visit.  Coagulation Studies: No results for input(s): LABPROT, INR in the last 72 hours.  Urinalysis:  Recent Labs Lab 08/19/16 1109  COLORURINE YELLOW*  LABSPEC 1.015  PHURINE 8.0  GLUCOSEU NEGATIVE  HGBUR MODERATE*  BILIRUBINUR NEGATIVE  KETONESUR NEGATIVE  PROTEINUR 100*  NITRITE POSITIVE*  LEUKOCYTESUR LARGE*    Lipid Panel:  No results found for: CHOL, TRIG, HDL, CHOLHDL, VLDL, LDLCALC  HgbA1C: No results found for: HGBA1C  Urine Drug Screen:  No results found for: LABOPIA, COCAINSCRNUR, LABBENZ, AMPHETMU, THCU, LABBARB  Alcohol Level: No results for input(s): ETH in the last 168 hours.  Other results: EKG: sinus rhythm at 94 bpm.  Imaging: Ct Head Wo Contrast  Result Date: 08/19/2016 CLINICAL  DATA:  Syncope this morning EXAM: CT HEAD WITHOUT CONTRAST TECHNIQUE: Contiguous axial images were obtained from the base of the skull through the vertex without intravenous contrast. COMPARISON:  None. FINDINGS: Brain: No intracranial hemorrhage, mass effect or midline shift. No acute cortical infarction. No mass lesion is noted on this unenhanced scan. Mild cerebral atrophy. Moderate periventricular and patchy subcortical white matter decreased attenuation probable due to chronic small vessel ischemic changes. Vascular: Minimal atherosclerotic calcifications of carotid siphon. Skull: No skull fracture. Sinuses/Orbits: No acute findings Other: None IMPRESSION: No acute intracranial abnormality. Mild cerebral atrophy. Moderate periventricular and patchy subcortical white matter decreased attenuation probable due to chronic small vessel ischemic changes. No acute cortical infarction. Electronically Signed   By: Lahoma Crocker M.D.   On: 08/19/2016 09:57   Mr Brain Wo Contrast  Result Date: 08/20/2016 CLINICAL DATA:  82 year old female with intermittent confusion, near syncope. Period of unresponsiveness. Initial encounter. EXAM: MRI HEAD WITHOUT CONTRAST TECHNIQUE:  Multiplanar, multiecho pulse sequences of the brain and surrounding structures were obtained without intravenous contrast. COMPARISON:  Head CT without contrast 08/19/2016. FINDINGS: Brain: Punctate area of increased trace diffusion signal in the inferior right parietal lobe white matter on series 5, image 69, but this is not appear restricted on ADC. Patchy and confluent bilateral cerebral white matter T2 and FLAIR hyperintensity, including in the right parietal region. There is some deep white matter capsule involvement. There is superimposed mild to moderate T2 heterogeneity throughout the deep gray matter nuclei. Evidence of a small chronic lacunar infarct in the medial right thalamus (series 9, image 34). Gyriform hemosiderin in the anterior right middle frontal gyrus (series 8, image 20). Possible trace siderosis also along the anterior left frontal lobe on image 17. Possible chronic microhemorrhage in the posterior right temporal lobe on image 16. No cortical encephalomalacia. Subtle chronic lacunar infarcts in the bilateral cerebellar hemispheres on series 6, images 8 and 9. Brainstem appears normal. No restricted diffusion to suggest acute infarction. No midline shift, mass effect, evidence of mass lesion, ventriculomegaly, extra-axial collection or acute intracranial hemorrhage. Cervicomedullary junction and pituitary are within normal limits. Vascular: Major intracranial vascular flow voids are preserved, although the distal left vertebral artery appears dominant but with decreased flow void conspicuity at the skullbase and evidence of superimposed atherosclerosis. Skull and upper cervical spine: Negative. Normal bone marrow signal. Sinuses/Orbits: Normal orbit soft tissues. Prior maxillary antrostomy and ethmoidectomy with could paranasal sinus pneumatization. Other: Visible internal auditory structures appear normal. Mastoids are clear. Negative scalp soft tissues. IMPRESSION: 1.  No acute intracranial  abnormality. 2. Punctate subacute appearing right parietal lobe white matter lacunar infarct. Underlying advanced cerebral white matter signal changes with evidence of lesser chronic small vessel ischemia in the deep gray matter nuclei and cerebellum. 3. Mild superficial siderosis in the right frontal lobe, possibly trace involvement also of the anterior left frontal lobe. Electronically Signed   By: Genevie Ann M.D.   On: 08/20/2016 09:37   US Carotid Bilateral  Result Date: 08/19/2016 CLINICAL DATA:  Right neck bruit EXAM: BILATERAL CAROTID DUPLEX ULTRASOUND TECHNIQUE: Pearline Cables scale imaging, color Doppler and duplex ultrasound were performed of bilateral carotid and vertebral arteries in the neck. COMPARISON:  None. FINDINGS: Criteria: Quantification of carotid stenosis is based on velocity parameters that correlate the residual internal carotid diameter with NASCET-based stenosis levels, using the diameter of the distal internal carotid lumen as the denominator for stenosis measurement. The following velocity measurements were obtained: RIGHT ICA:  79 cm/sec CCA:  80 cm/sec SYSTOLIC ICA/CCA RATIO:  1.0 DIASTOLIC ICA/CCA RATIO:  0.9 ECA:  76 cm/sec LEFT ICA:  91 cm/sec CCA:  74 cm/sec SYSTOLIC ICA/CCA RATIO:  1.2 DIASTOLIC ICA/CCA RATIO:  2.3 ECA:  95 cm/sec RIGHT CAROTID ARTERY: Minimal intimal thickening in the bulb. Low resistance internal carotid Doppler pattern. RIGHT VERTEBRAL ARTERY:  Antegrade. LEFT CAROTID ARTERY: Marked tortuosity in the common carotid. Mild calcified plaque in the bulb. Low resistance internal carotid Doppler pattern. LEFT VERTEBRAL ARTERY:  Antegrade. IMPRESSION: Less than 50% stenosis in the right and left internal carotid arteries. Electronically Signed   By: Marybelle Killings M.D.   On: 08/19/2016 16:48   US Venous Img Lower Unilateral Left  Result Date: 08/19/2016 CLINICAL DATA:  Extensive left lower extremity edema for 1 week. History of bladder cancer. History of prior DVT.  Evaluate for acute or chronic DVT. EXAM: LEFT LOWER EXTREMITY VENOUS DOPPLER ULTRASOUND TECHNIQUE: Gray-scale sonography with graded compression, as well as color Doppler and duplex ultrasound were performed to evaluate the lower extremity deep venous systems from the level of the common femoral vein and including the common femoral, femoral, profunda femoral, popliteal and calf veins including the posterior tibial, peroneal and gastrocnemius veins when visible. The superficial great saphenous vein was also interrogated. Spectral Doppler was utilized to evaluate flow at rest and with distal augmentation maneuvers in the common femoral, femoral and popliteal veins. COMPARISON:  None. FINDINGS: Contralateral Common Femoral Vein: Respiratory phasicity is normal and symmetric with the symptomatic side. No evidence of thrombus. Normal compressibility. There is mixed echogenic occlusive thrombus seen extending from the left common femoral vein (images 7 and 8) through the saphenofemoral junction (image 13), the deep femoral vein (image 16) and throughout the interrogated course of the left femoral vein (representative images 19, 22 and 25). There is eccentric mixed echogenic occlusive thrombus within the left popliteal vein (images 26 and 27) with an associated internal echogenic foci (image 27) suggestive of calcification. The posterior tibial and peroneal veins appear thrombosed throughout their imaged course. Other Findings:  None. IMPRESSION: Examination is positive for extensive left lower extremity DVT extending from the left common femoral vein through the imaged tibial veins. This extensive occlusive DVT is age indeterminate though has imaging findings suggestive of a chronic etiology though in the absence of prior examinations, an acute on chronic process is not excluded. Clinical correlation is advised. Electronically Signed   By: Sandi Mariscal M.D.   On: 08/19/2016 16:47     Assessment/Plan: 82 year old  female presenting after an episode of staring.  Neurological examination is fairly unremarkable other than her mental status.  Unclear what her baseline is.  MRI of the brain reviewed and shows a punctate subacute right parietal lobe infarct and extensive white matter changes suggestive of small vessel disease.  WBC count elevated with evidence of UTI.   Carotid dopplers show no evidence of hemodynamically significant stenosis.  EEG reviewed and shows no epileptiform discharges.  Staring may have been secondary to mental status.  Patient with infection and mental status may suggest an encephalopathy related to the same.  Patient also with DVT and subacute infarct.  Dopplers unremarkable but would like to rule out cardiac abnormality as well.  Further work up recommended.  Recommendations.   1.  Echocardiogram (bubble study) 2.  A1c, Lipid panel 3.  Agree with treatment of UTI 4.  Anticonvulsant therapy not indicated at this time 5.  Telemetry 6.  PT/OT/Speech therapy  Alexis Goodell, MD Neurology [phone] 08/20/2016, 2:04 PM

## 2016-08-20 NOTE — Progress Notes (Addendum)
[facility] [place] NAME: Yvonne Smith    MR#:  [id number]  DATE OF BIRTH:  05-30-1934  SUBJECTIVE:  CHIEF COMPLAINT:   Chief Complaint  Patient presents with  . Near Syncope  Feels better.  Daughter at bedside  REVIEW OF SYSTEMS:  Review of Systems  Constitutional: Negative for chills, fever and weight loss.  HENT: Negative for nosebleeds and sore throat.   Eyes: Negative for blurred vision.  Respiratory: Negative for cough, shortness of breath and wheezing.   Cardiovascular: Negative for chest pain, orthopnea, leg swelling and PND.  Gastrointestinal: Negative for abdominal pain, constipation, diarrhea, heartburn, nausea and vomiting.  Genitourinary: Negative for dysuria and urgency.  Musculoskeletal: Negative for back pain.  Skin: Negative for rash.  Neurological: Negative for dizziness, speech change, focal weakness and headaches.  Endo/Heme/Allergies: Does not bruise/bleed easily.  Psychiatric/Behavioral: Negative for depression.   DRUG ALLERGIES:   Allergies  Allergen Reactions  . Latex Hives   VITALS:  Blood pressure (!) 111/51, pulse 87, temperature 99 F (37.2 C), temperature source Oral, resp. rate 18, height 5\' 4"  (1.626 m), weight 59.9 kg (132 lb 1.6 oz), SpO2 93 %. PHYSICAL EXAMINATION:  Physical Exam  Constitutional: She is oriented to person, place, and time and well-developed, well-nourished, and in no distress.  HENT:  Head: Normocephalic and atraumatic.  Eyes: Conjunctivae and EOM are normal. Pupils are equal, round, and reactive to light.  Neck: Normal range of motion. Neck supple. No tracheal deviation present. No thyromegaly present.  Cardiovascular: Normal rate, regular rhythm and normal heart sounds.   Pulmonary/Chest: Effort normal and breath sounds normal. No respiratory distress. She has no wheezes. She exhibits no tenderness.  Abdominal: Soft. Bowel sounds are normal. She exhibits no distension.  There is no tenderness.  Genitourinary:  Genitourinary Comments: Indwelling foley  Musculoskeletal: Normal range of motion.  Neurological: She is alert and oriented to person, place, and time. No cranial nerve deficit.  Skin: Skin is warm and dry. No rash noted.  Psychiatric: Mood and affect normal.   LABORATORY PANEL:   CBC  Recent Labs Lab 08/19/16 0930  WBC 13.4*  HGB 13.7  HCT 40.9  PLT 125*   ------------------------------------------------------------------------------------------------------------------ Chemistries   Recent Labs Lab 08/19/16 0930 08/20/16 0320  NA 139 138  K 4.0 4.3  CL 101 105  CO2 28 26  GLUCOSE 140* 246*  BUN 36* 39*  CREATININE 1.09* 1.02*  CALCIUM 8.7* 7.8*  AST 20  --   ALT 23  --   ALKPHOS 68  --   BILITOT 0.9  --    RADIOLOGY:  Mr Brain Wo Contrast  Result Date: 08/20/2016 CLINICAL DATA:  82 year old female with intermittent confusion, near syncope. Period of unresponsiveness. Initial encounter. EXAM: MRI HEAD WITHOUT CONTRAST TECHNIQUE: Multiplanar, multiecho pulse sequences of the brain and surrounding structures were obtained without intravenous contrast. COMPARISON:  Head CT without contrast 08/19/2016. FINDINGS: Brain: Punctate area of increased trace diffusion signal in the inferior right parietal lobe white matter on series 5, image 69, but this is not appear restricted on ADC. Patchy and confluent bilateral cerebral white matter T2 and FLAIR hyperintensity, including in the right parietal region. There is some deep white matter capsule involvement. There is superimposed mild to moderate T2 heterogeneity throughout the deep gray matter nuclei. Evidence of a small chronic lacunar infarct in the medial right thalamus (series 9, image 34). Gyriform hemosiderin in the anterior right middle frontal gyrus (  series 8, image 20). Possible trace siderosis also along the anterior left frontal lobe on image 17. Possible chronic microhemorrhage in  the posterior right temporal lobe on image 16. No cortical encephalomalacia. Subtle chronic lacunar infarcts in the bilateral cerebellar hemispheres on series 6, images 8 and 9. Brainstem appears normal. No restricted diffusion to suggest acute infarction. No midline shift, mass effect, evidence of mass lesion, ventriculomegaly, extra-axial collection or acute intracranial hemorrhage. Cervicomedullary junction and pituitary are within normal limits. Vascular: Major intracranial vascular flow voids are preserved, although the distal left vertebral artery appears dominant but with decreased flow void conspicuity at the skullbase and evidence of superimposed atherosclerosis. Skull and upper cervical spine: Negative. Normal bone marrow signal. Sinuses/Orbits: Normal orbit soft tissues. Prior maxillary antrostomy and ethmoidectomy with could paranasal sinus pneumatization. Other: Visible internal auditory structures appear normal. Mastoids are clear. Negative scalp soft tissues. IMPRESSION: 1.  No acute intracranial abnormality. 2. Punctate subacute appearing right parietal lobe white matter lacunar infarct. Underlying advanced cerebral white matter signal changes with evidence of lesser chronic small vessel ischemia in the deep gray matter nuclei and cerebellum. 3. Mild superficial siderosis in the right frontal lobe, possibly trace involvement also of the anterior left frontal lobe. Electronically Signed   By: Genevie Ann M.D.   On: 08/20/2016 09:37   ASSESSMENT AND PLAN:  82 year old female visiting from [place] with history of hypertension who presents with a staring spell  1. Staring spell without loss of consciousness: carotid Doppler No significant carotid artery stenosis. Check orthostatic vitals Consult neurology for possible seizure, although doubt it.  This could be likely vasovagal  2.  UTI - Continue Rocephin for 3 days  3.  Left lower extremity DVT - confirmed on Doppler - Started on eliquis  for anti-coagulation  4. Essential hypertension: Continue outpatient medications.   5. Temporal arteritis currently on prednisone which will be continued  6. Acute kidney injury due to mild dehydration: Improving with IV fluids  7. Elevated troponin: due to demand ischemia from acute kidney injury Negative serial troponins    All the records are reviewed and case discussed with Care Management/Social Worker. Management plans discussed with the patient, family, daughter at bedside and they are in agreement.  CODE STATUS: DO NOT RESUSCITATE  TOTAL TIME TAKING CARE OF THIS PATIENT: 35 minutes.   More than 50% of the time was spent in counseling/coordination of care: YES  POSSIBLE D/C IN AM, DEPENDING ON CLINICAL CONDITION.   Max Sane M.D on 08/20/2016 at 5:13 PM  Between 7am to 6pm - Pager - [phone]  After 6pm go to [email or web address] - [facility]  Office  [phone]  CC: Primary care physician; No PCP Per Patient  Note: This dictation was prepared with Dragon dictation along with smaller phrase technology. Any transcriptional errors that result from this process are unintentional.

## 2016-08-20 NOTE — Progress Notes (Signed)
Inpatient Diabetes Program Recommendations  AACE/ADA: New Consensus Statement on Inpatient Glycemic Control (2015)  Target Ranges:  Prepandial:   less than 140 mg/dL      Peak postprandial:   less than 180 mg/dL (1-2 hours)      Critically ill patients:  140 - 180 mg/dL   Results for Yvonne Smith, Yvonne Smith (MRN [id number]) as of 08/20/2016 12:17  Ref. Range 08/19/2016 22:43 08/20/2016 07:51 08/20/2016 12:00  Glucose-Capillary Latest Ref Range: 65 - 99 mg/dL 213 (H) 153 (H) 272 (H)  Results for Yvonne Smith, Yvonne Smith (MRN [id number]) as of 08/20/2016 12:17  Ref. Range 08/20/2016 03:20  Glucose Latest Ref Range: 65 - 99 mg/dL 246 (H)   Review of Glycemic Control  Diabetes history: No Outpatient Diabetes medications: NA Current orders for Inpatient glycemic control: None  Inpatient Diabetes Program Recommendations: Correction (SSI): Noted lab glucose 246 mg/dl this morning at 3:20 am and current finger stick glucose 272 mg/dl.  While inpatient and ordered steroids, please consider ordering CBGs with Novolog correction scale ACHS. HgbA1C: Noted patient is taking Predinsone as an outpatient for temporal arteritis and Prednisone has been continued as an inpatient. Please consider ordering an A1C to evaluate glycemic control over the past 2-3 months.  Thanks, Barnie Alderman, RN, MSN, CDE Diabetes Coordinator Inpatient Diabetes Program [phone] (Team Pager from 8am to 5pm)

## 2016-08-21 LAB — CBC
HEMATOCRIT: 31.6 % — AB (ref 35.0–47.0)
HEMOGLOBIN: 11 g/dL — AB (ref 12.0–16.0)
MCH: 27.5 pg (ref 26.0–34.0)
MCHC: 34.7 g/dL (ref 32.0–36.0)
MCV: 79.3 fL — ABNORMAL LOW (ref 80.0–100.0)
Platelets: 121 10*3/uL — ABNORMAL LOW (ref 150–440)
RBC: 3.99 MIL/uL (ref 3.80–5.20)
RDW: 21.2 % — ABNORMAL HIGH (ref 11.5–14.5)
WBC: 7.1 10*3/uL (ref 3.6–11.0)

## 2016-08-21 LAB — BASIC METABOLIC PANEL
Anion gap: 6 (ref 5–15)
BUN: 25 mg/dL — AB (ref 6–20)
CO2: 24 mmol/L (ref 22–32)
CREATININE: 0.66 mg/dL (ref 0.44–1.00)
Calcium: 7.9 mg/dL — ABNORMAL LOW (ref 8.9–10.3)
Chloride: 106 mmol/L (ref 101–111)
GFR calc Af Amer: >60 mL/min (ref >60)
GFR calc non Af Amer: >60 mL/min (ref >60)
Glucose, Bld: 186 mg/dL — ABNORMAL HIGH (ref 65–99)
Potassium: 3.8 mmol/L (ref 3.5–5.1)
Sodium: 136 mmol/L (ref 135–145)

## 2016-08-21 LAB — LIPID PANEL
CHOLESTEROL: 156 mg/dL (ref 0–200)
HDL: 42 mg/dL (ref >40)
LDL Cholesterol: 89 mg/dL (ref 0–99)
TRIGLYCERIDES: 126 mg/dL (ref <150)
Total CHOL/HDL Ratio: 3.7 RATIO
VLDL: 25 mg/dL (ref 0–40)

## 2016-08-21 LAB — GLUCOSE, CAPILLARY: Glucose-Capillary: 219 mg/dL — ABNORMAL HIGH (ref 65–99)

## 2016-08-21 MED ORDER — APIXABAN 5 MG PO TABS: 10.0000 mg | ORAL_TABLET | Freq: Two times a day (BID) | ORAL | 0 refills | Status: AC

## 2016-08-21 MED ORDER — CEFTRIAXONE SODIUM-DEXTROSE 1-3.74 GM-% IV SOLR
1.0000 g | INTRAVENOUS | Status: DC
  Administered 2016-08-21: 1 g via INTRAVENOUS
  Filled 2016-08-21: qty 50

## 2016-08-21 MED ORDER — APIXABAN 5 MG PO TABS: 10.0000 mg | ORAL_TABLET | Freq: Two times a day (BID) | ORAL | 0 refills | Status: DC

## 2016-08-21 MED ORDER — APIXABAN 5 MG PO TABS: 5.0000 mg | ORAL_TABLET | Freq: Two times a day (BID) | ORAL | 0 refills | Status: AC

## 2016-08-21 MED ORDER — APIXABAN 5 MG PO TABS: 5.0000 mg | ORAL_TABLET | Freq: Two times a day (BID) | ORAL | 0 refills | Status: DC

## 2016-08-21 NOTE — Discharge Instructions (Signed)
Bleeding Precautions When on Anticoagulant Therapy WHAT IS ANTICOAGULANT THERAPY? Anticoagulant therapy is taking medicine to prevent or reduce blood clots. It is also called blood thinner therapy. Blood clots that form in your blood vessels can be dangerous. They can break loose and travel to your heart, lungs, or brain. This increases your risk of a heart attack or stroke. Anticoagulant therapy causes blood to clot more slowly. You may need anticoagulant therapy if you have:  A medical condition that increases the likelihood that blood clots will form.  A heart defect or a problem with heart rhythm. It is also a common treatment after heart surgery, such as valve replacement. WHAT ARE COMMON TYPES OF ANTICOAGULANT THERAPY? Anticoagulant medicine can be injected or taken by mouth.If you need anticoagulant therapy quickly at the hospital, the medicine may be injected under your skin or given through an IV tube. Heparin is a common example of an anticoagulant that you may get at the hospital. Most anticoagulant therapy is in the form of pills that you take at home every day. These may include:  Aspirin. This common blood thinner works by preventing blood cells (platelets) from sticking together to form a clot. Aspirin is not as strong as anticoagulants that slow down the time that it takes for your body to form a clot.  Clopidogrel. This is a newer type of drug that affects platelets. It is stronger than aspirin.  Warfarin. This is the most common anticoagulant. It changes the way your body uses vitamin K, a vitamin that helps your blood to clot. The risk of bleeding is higher with warfarin than with aspirin. You will need frequent blood tests to make sure you are taking the safest amount.  New anticoagulants. Several new drugs have been approved. They are all taken by mouth. Studies show that these drugs work as well as warfarin. They do not require blood testing. They may cause less bleeding  risk than warfarin. WHAT DO I NEED TO REMEMBER WHEN TAKING ANTICOAGULANT THERAPY? Anticoagulant therapy decreases your risk of forming a blood clot, but it increases your risk of bleeding. Work closely with your health care provider to make sure you are taking your medicine safely. These tips can help:  Learn ways to reduce your risk of bleeding.  If you are taking warfarin:  Have blood tests as ordered by your health care provider.  Do not make any sudden changes to your diet. Vitamin K in your diet can make warfarin less effective.  Do not get pregnant. This medicine may cause birth defects.  Take your medicine at the same time every day. If you forget to take your medicine, take it as soon as you remember. If you miss a whole day, do not double your dose of medicine. Take your normal dose and call your health care provider to check in.  Do not stop taking your medicine on your own.  Tell your health care provider before you start taking any new medicine, vitamin, or herbal product. Some of these could interfere with your therapy.  Tell all of your health care providers that you are on anticoagulant therapy.  Do not have surgery, medical procedures, or dental work until you tell your health care provider that you are on anticoagulant therapy. WHAT CAN AFFECT HOW ANTICOAGULANTS WORK? Certain foods, vitamins, medicines, supplements, and herbal medicines change the way that anticoagulant therapy works. They may increase or decrease the effects of your anticoagulant therapy. Either result can be dangerous for you.  Many over-the-counter medicines for pain, colds, or stomach problems interfere with anticoagulant therapy. Take these only as told by your health care provider.  Do not drink alcohol. It can interfere with your medicine and increase your risk of an injury that causes bleeding.  If you are taking warfarin, do not begin eating more foods that contain vitamin K. These include  leafy green vegetables. Ask your health care provider if you should avoid any foods. WHAT ARE SOME WAYS TO PREVENT BLEEDING? You can prevent bleeding by taking certain precautions:  Be extra careful when you use knives, scissors, or other sharp objects.  Use an electric razor instead of a blade.  Do not use toothpicks.  Use a soft toothbrush.  Wear shoes that have nonskid soles.  Use bath mats and handrails in your bathroom.  Wear gloves while you do yard work.  Wear a helmet when you ride a bike.  Wear your seat belt.  Prevent falls by removing loose rugs and extension cords from areas where you walk.  Do not play contact sports or participate in other activities that have a high risk of injury. [place] PROVIDER? Call your health care provider if:  You miss a dose of medicine:  And you are not sure what to do.  For more than one day.  You have:  Menstrual bleeding that is heavier than normal.  Blood in your urine.  A bloody nose or bleeding gums.  Easy bruising.  Blood in your stool (feces) or have black and tarry stool.  Side effects from your medicine.  You feel weak or dizzy.  You become pregnant. Seek immediate medical care if:  You have bleeding that will not stop.  You have sudden and severe headache or belly pain.  You vomit or you cough up bright red blood.  You have a severe blow to your head. WHAT ARE SOME QUESTIONS TO ASK MY HEALTH CARE PROVIDER?  What is the best anticoagulant therapy for my condition?  What side effects should I watch for?  When should I take my medicine? What should I do if I forget to take it?  Will I need to have regular blood tests?  Do I need to change my diet? Are there foods or drinks that I should avoid?  What activities are safe for me?  What should I do if I want to get pregnant? This information is not intended to replace advice given to you by your health care provider.  Make sure you discuss any questions you have with your health care provider. Document Released: 08/06/2015 Document Reviewed: 08/06/2015 Elsevier Interactive Patient Education  2017 [employer].   Deep Vein Thrombosis A deep vein thrombosis (DVT) is a blood clot (thrombus) that usually occurs in a deep, larger vein of the lower leg or the pelvis, or in an upper extremity such as the arm. These are dangerous and can lead to serious and even life-threatening complications if the clot travels to the lungs. A DVT can damage the valves in your leg veins so that instead of flowing upward, the blood pools in the lower leg. This is called post-thrombotic syndrome, and it can result in pain, swelling, discoloration, and sores on the leg. What are the causes? A DVT is caused by the formation of a blood clot in your leg, pelvis, or arm. Usually, several things contribute to the formation of blood clots. A clot may develop when:  Your blood flow slows  down.  Your vein becomes damaged in some way.  You have a condition that makes your blood clot more easily. What increases the risk? A DVT is more likely to develop in:  People who are older, especially over 77 years of age.  People who are overweight (obese).  People who sit or lie still for a long time, such as during long-distance travel (over 4 hours), bed rest, hospitalization, or during recovery from certain medical conditions like a stroke.  People who do not engage in much physical activity (sedentary lifestyle).  People who have chronic breathing disorders.  People who have a personal or family history of blood clots or blood clotting disease.  People who have peripheral vascular disease (PVD), diabetes, or some types of cancer.  People who have heart disease, especially if the person had a recent heart attack or has congestive heart failure.  People who have neurological diseases that affect the legs (leg paresis).  People who have  had a traumatic injury, such as breaking a hip or leg.  People who have recently had major or lengthy surgery, especially on the hip, knee, or abdomen.  People who have had a central line placed inside a large vein.  People who take medicines that contain the hormone estrogen. These include birth control pills and hormone replacement therapy.  Pregnancy or during childbirth or the postpartum period.  Long plane flights (over 8 hours). What are the signs or symptoms?   Symptoms of a DVT can include:  Swelling of your leg or arm, especially if one side is much worse.  Warmth and redness of your leg or arm, especially if one side is much worse.  Pain in your arm or leg. If the clot is in your leg, symptoms may be more noticeable or worse when you stand or walk.  A feeling of pins and needles, if the clot is in the arm. The symptoms of a DVT that has traveled to the lungs (pulmonary embolism, PE) usually start suddenly and include:  Shortness of breath while active or at rest.  Coughing or coughing up blood or blood-tinged mucus.  Chest pain that is often worse with deep breaths.  Rapid or irregular heartbeat.  Feeling light-headed or dizzy.  Fainting.  Feeling anxious.  Sweating. There may also be pain and swelling in a leg if that is where the blood clot started. These symptoms may represent a serious problem that is an emergency. Do not wait to see if the symptoms will go away. Get medical help right away. Call your local emergency services (911 in the [place] Do not drive yourself to the hospital.  How is this diagnosed? Your health care provider will take a medical history and perform a physical exam. You may also have other tests, including:  Blood tests to assess the clotting properties of your blood.  Imaging tests, such as CT, ultrasound, MRI, X-ray, and other tests to see if you have clots anywhere in your body. How is this treated? After a DVT is identified, it  can be treated. The type of treatment that you receive depends on many factors, such as the cause of your DVT, your risk for bleeding or developing more clots, and other medical conditions that you have. Sometimes, a combination of treatments is necessary. Treatment options may be combined and include:  Monitoring the blood clot with ultrasound.  Taking medicines by mouth, such as newer blood thinners (anticoagulants), thrombolytics, or warfarin.  Taking anticoagulant medicine by injection  or through an IV tube.  Wearing compression stockings or using different types ofdevices.  Surgery (rare) to remove the blood clot or to place a filter in your abdomen to stop the blood clot from traveling to your lungs. Treatments for a DVT are often divided into immediate treatment and long-term treatment (up to 3 months after DVT). You can work with your health care provider to choose the treatment program that is best for you. Follow these instructions at home: If you are taking a newer oral anticoagulant:  Take the medicine every single day at the same time each day.  Understand what foods and drugs interact with this medicine.  Understand that there are no regular blood tests required when using this medicine.  Understand the side effects of this medicine, including excessive bruising or bleeding. Ask your health care provider or pharmacist about other possible side effects. If you are taking warfarin:  Understand how to take warfarin and know which foods can affect how warfarin works in Veterinary surgeon.  Understand that it is dangerous to take too much or too little warfarin. Too much warfarin increases the risk of bleeding. Too little warfarin continues to allow the risk for blood clots.  Follow your PT and INR blood testing schedule. The PT and INR results allow your health care provider to adjust your dose of warfarin. It is very important that you have your PT and INR tested as often as told by  your health care provider.  Avoid major changes in your diet, or tell your health care provider before you change your diet. Arrange a visit with a registered dietitian to answer your questions. Many foods, especially foods that are high in vitamin K, can interfere with warfarin and affect the PT and INR results. Eat a consistent amount of foods that are high in vitamin K, such as:  Spinach, kale, broccoli, cabbage, collard greens, turnip greens, Brussels sprouts, peas, cauliflower, seaweed, and parsley.  Beef liver and pork liver.  Green tea.  Soybean oil.  Tell your health care provider about any and all medicines, vitamins, and supplements that you take, including aspirin and other over-the-counter anti-inflammatory medicines. Be especially cautious with aspirin and anti-inflammatory medicines. Do not take those before you ask your health care provider if it is safe to do so. This is important because many medicines can interfere with warfarin and affect the PT and INR results.  Do not start or stop taking any over-the-counter or prescription medicine unless your health care provider or pharmacist tells you to do so. If you take warfarin, you will also need to do these things:  Hold pressure over cuts for longer than usual.  Tell your dentist and other health care providers that you are taking warfarin before you have any procedures in which bleeding may occur.  Avoid alcohol or drink very small amounts. Tell your health care provider if you change your alcohol intake.  Do not use tobacco products, including cigarettes, chewing tobacco, and e-cigarettes. If you need help quitting, ask your health care provider.  Avoid contact sports. General instructions  Take over-the-counter and prescription medicines only as told by your health care provider. Anticoagulant medicines can have side effects, including easy bruising and difficulty stopping bleeding. If you are prescribed an  anticoagulant, you will also need to do these things:  Hold pressure over cuts for longer than usual.  Tell your dentist and other health care providers that you are taking anticoagulants before you have any procedures  in which bleeding may occur.  Avoid contact sports.  Wear a medical alert bracelet or carry a medical alert card that says you have had a PE.  Ask your health care provider how soon you can go back to your normal activities. Stay active to prevent new blood clots from forming.  Make sure to exercise while traveling or when you have been sitting or standing for a long period of time. It is very important to exercise. Exercise your legs by walking or by tightening and relaxing your leg muscles often. Take frequent walks.  Wear compression stockings as told by your health care provider to help prevent more blood clots from forming.  Do not use tobacco products, including cigarettes, chewing tobacco, and e-cigarettes. If you need help quitting, ask your health care provider.  Keep all follow-up appointments with your health care provider. This is important. How is this prevented? Take these actions to decrease your risk of developing another DVT:  Exercise regularly. For at least 30 minutes every day, engage in:  Activity that involves moving your arms and legs.  Activity that encourages good blood flow through your body by increasing your heart rate.  Exercise your arms and legs every hour during long-distance travel (over 4 hours). Drink plenty of water and avoid drinking alcohol while traveling.  Avoid sitting or lying in bed for long periods of time without moving your legs.  Maintain a weight that is appropriate for your height. Ask your health care provider what weight is healthy for you.  If you are a woman who is over 68 years of age, avoid unnecessary use of medicines that contain estrogen. These include birth control pills.  Do not smoke, especially if you  take estrogen medicines. If you need help quitting, ask your health care provider. If you are hospitalized, prevention measures may include:  Early walking after surgery, as soon as your health care provider says that it is safe.  Receiving anticoagulants to prevent blood clots.If you cannot take anticoagulants, other options may be available, such as wearing compression stockings or using different types of devices. Get help right away if:  You have new or increased pain, swelling, or redness in an arm or leg.  You have numbness or tingling in an arm or leg.  You have shortness of breath while active or at rest.  You have chest pain.  You have a rapid or irregular heartbeat.  You feel light-headed or dizzy.  You cough up blood.  You notice blood in your vomit, bowel movement, or urine. These symptoms may represent a serious problem that is an emergency. Do not wait to see if the symptoms will go away. Get medical help right away. Call your local emergency services (911 in the [place] Do not drive yourself to the hospital.  This information is not intended to replace advice given to you by your health care provider. Make sure you discuss any questions you have with your health care provider. Document Released: 08/25/2005 Document Revised: 01/31/2016 Document Reviewed: 12/20/2014 Elsevier Interactive Patient Education  2017 [employer].

## 2016-08-21 NOTE — Care Management Important Message (Signed)
Important Message  Patient Details  Name: Yvonne Smith MRN: [id number] Date of Birth: 1934/05/10   Medicare Important Message Given:  N/A - LOS <3 / Initial given by admissions    Katrina Stack, RN 08/21/2016, 9:53 AM

## 2016-08-21 NOTE — Progress Notes (Signed)
Discharge instructions along with home medication list and follow up gone over with patient and daughter. Both verbalized that they understood instructions. Iv and telemetry removed. Printed rx for eliquis given to patient. No c/o pain no distress noted. Patient to be discharged home.

## 2016-08-21 NOTE — Care Management (Signed)
Patient to discharge home today on Eliquis.  She is returning to her home in [place] today.  Contacted [facility] and obtained prior auth for the Eliquis: [id number].  Provided patient with 30 day coupon.

## 2016-08-22 LAB — HEMOGLOBIN A1C
HEMOGLOBIN A1C: 7.3 % — AB (ref 4.8–5.6)
Mean Plasma Glucose: 163 mg/dL

## 2016-08-23 NOTE — Discharge Summary (Signed)
[facility] [place] NAME: Yvonne Smith    MR#:  [id number]  DATE OF BIRTH:  06-Sep-1934  DATE OF ADMISSION:  08/19/2016   ADMITTING PHYSICIAN: Bettey Costa, MD  DATE OF DISCHARGE: 08/21/2016 11:00 AM  PRIMARY CARE PHYSICIAN: Tracie Harrier, MD   ADMISSION DIAGNOSIS:  Edema [R60.9] Temporal arteritis ([place]) [M31.6] Near syncope [R55] Swelling of left lower extremity [M79.89] Bruit of right carotid artery [R09.89] Syncope, unspecified syncope type [R55] DISCHARGE DIAGNOSIS:  Active Problems:   Elevated troponin   DVT (deep vein thrombosis) in pregnancy ([place])   Syncope  SECONDARY DIAGNOSIS:   Past Medical History:  Diagnosis Date  . Aneurysm of superficial temporal artery (HCC)   . bladder cancer   . Hypertension    HOSPITAL COURSE:  82 year old female visiting from [place] with history of hypertension who presents with a staring spell  1. Staring spell without loss of consciousness: - MRI of the brain reviewed and shows a punctate subacute right parietal lobe infarct and extensive white matter changes suggestive of small vessel disease.  WBC count elevated with evidence of UTI.   Carotid dopplers show no evidence of hemodynamically significant stenosis.  EEG reviewed and shows no epileptiform discharges.   2.  UTI - Treated  3.  Left lower extremity DVT - confirmed on Doppler - Started on eliquis for anti-coagulation  4. Essential hypertension: Continueoutpatient medications.   5. Temporal arteritis currently on prednisone which will be continued  6. Acute kidney injury due to mild dehydration: resolved with IV fluids  7. Elevated troponin: due to demand ischemia from acute kidney injury Negative serial troponins  DISCHARGE CONDITIONS:  STABLE CONSULTS OBTAINED:  Treatment Team:  Catarina Hartshorn, MD DRUG ALLERGIES:   Allergies  Allergen Reactions  . Latex Hives   DISCHARGE MEDICATIONS:    Allergies as of 08/21/2016      Reactions   Latex Hives      Medication List    TAKE these medications   amLODipine 5 MG tablet Commonly known as:  NORVASC Take 5 mg by mouth daily.   apixaban 5 MG Tabs tablet Commonly known as:  ELIQUIS Take 2 tablets (10 mg total) by mouth 2 (two) times daily. 10 mg PO bid for 1st 7 days followed by 5 mg PO bid   apixaban 5 MG Tabs tablet Commonly known as:  ELIQUIS Take 1 tablet (5 mg total) by mouth 2 (two) times daily. Start taking on:  08/26/2016   busPIRone 5 MG tablet Commonly known as:  BUSPAR Take 5 mg by mouth 2 (two) times daily.   cholecalciferol 1000 units tablet Commonly known as:  VITAMIN D Take 2,000 Units by mouth daily.   losartan-hydrochlorothiazide 100-12.5 MG tablet Commonly known as:  HYZAAR Take 1 tablet by mouth daily.   omeprazole 40 MG capsule Commonly known as:  PRILOSEC Take 40 mg by mouth daily.   predniSONE 20 MG tablet Commonly known as:  DELTASONE Take 20 mg by mouth 2 (two) times daily with a meal. 2 tablets in the morning and 1 at night   PRESERVISION AREDS 2 PO Take 1 tablet by mouth daily.   sertraline 25 MG tablet Commonly known as:  ZOLOFT Take 25 mg by mouth daily.   traMADol 50 MG tablet Commonly known as:  ULTRAM Take 25 mg by mouth at bedtime.        DISCHARGE INSTRUCTIONS:   DIET:  Regular diet DISCHARGE CONDITION:  Good ACTIVITY:  Activity as tolerated OXYGEN:  Home Oxygen: No.  Oxygen Delivery: room air DISCHARGE LOCATION:  home   If you experience worsening of your admission symptoms, develop shortness of breath, life threatening emergency, suicidal or homicidal thoughts you must seek medical attention immediately by calling 911 or calling your MD immediately  if symptoms less severe.  You Must read complete instructions/literature along with all the possible adverse reactions/side effects for all the Medicines you take and that have been prescribed to you. Take  any new Medicines after you have completely understood and accpet all the possible adverse reactions/side effects.   Please note  You were cared for by a hospitalist during your hospital stay. If you have any questions about your discharge medications or the care you received while you were in the hospital after you are discharged, you can call the unit and asked to speak with the hospitalist on call if the hospitalist that took care of you is not available. Once you are discharged, your primary care physician will handle any further medical issues. Please note that NO REFILLS for any discharge medications will be authorized once you are discharged, as it is imperative that you return to your primary care physician (or establish a relationship with a primary care physician if you do not have one) for your aftercare needs so that they can reassess your need for medications and monitor your lab values.    On the day of Discharge:  VITAL SIGNS:  Blood pressure (!) 129/58, pulse 82, temperature 97.6 F (36.4 C), temperature source Oral, resp. rate 18, height 5\' 4"  (1.626 m), weight 59.9 kg (132 lb 1.6 oz), SpO2 99 %. PHYSICAL EXAMINATION:  GENERAL:  82 y.o.-year-old patient lying in the bed with no acute distress.  EYES: Pupils equal, round, reactive to light and accommodation. No scleral icterus. Extraocular muscles intact.  HEENT: Head atraumatic, normocephalic. Oropharynx and nasopharynx clear.  NECK:  Supple, no jugular venous distention. No thyroid enlargement, no tenderness.  LUNGS: Normal breath sounds bilaterally, no wheezing, rales,rhonchi or crepitation. No use of accessory muscles of respiration.  CARDIOVASCULAR: S1, S2 normal. No murmurs, rubs, or gallops.  ABDOMEN: Soft, non-tender, non-distended. Bowel sounds present. No organomegaly or mass.  EXTREMITIES: No pedal edema, cyanosis, or clubbing.  NEUROLOGIC: Cranial nerves II through XII are intact. Muscle strength 5/5 in all  extremities. Sensation intact. Gait not checked.  PSYCHIATRIC: The patient is alert and oriented x 3.  SKIN: No obvious rash, lesion, or ulcer.  DATA REVIEW:   CBC  Recent Labs Lab 08/21/16 0605  WBC 7.1  HGB 11.0*  HCT 31.6*  PLT 121*    Chemistries   Recent Labs Lab 08/19/16 0930  08/21/16 0605  NA 139  < > 136  K 4.0  < > 3.8  CL 101  < > 106  CO2 28  < > 24  GLUCOSE 140*  < > 186*  BUN 36*  < > 25*  CREATININE 1.09*  < > 0.66  CALCIUM 8.7*  < > 7.9*  AST 20  --   --   ALT 23  --   --   ALKPHOS 68  --   --   BILITOT 0.9  --   --   < > = values in this interval not displayed.   Follow-up Information    [facility]. Schedule an appointment as soon as possible for a visit in 1 week(s).   Why:  You will need to call for an  appointment, ccs Contact information: [phone] (Work) [phone] (Fax) [place] [address]              Management plans discussed with the patient, family and they are in agreement.  CODE STATUS: DNR  TOTAL TIME TAKING CARE OF THIS PATIENT: 45 minutes.    Max Sane M.D on 08/23/2016 at 4:40 PM  Between 7am to 6pm - Pager - [phone]  After 6pm go to [email or web address] - Proofreader  [facility]  Office  [phone]  CC: Primary care physician; Tracie Harrier, MD   Note: This dictation was prepared with Dragon dictation along with smaller phrase technology. Any transcriptional errors that result from this process are unintentional.

## 2017-03-08 DEATH — deceased

## 2017-05-31 IMAGING — MR MR HEAD W/O CM
10 series · 40 of 48 positions shown · non-contrast
Comparison: Head CT without contrast 08/19/2016.

CLINICAL DATA: 82-year-old female with intermittent confusion, near
syncope. Period of unresponsiveness. Initial encounter.

EXAM:
MRI HEAD WITHOUT CONTRAST
TECHNIQUE: Multiplanar, multiecho pulse sequences of the brain and surrounding
structures were obtained without intravenous contrast.

[Series 2: T1 · sagittal · 5.0mm · 0.45mm/px · 4 of 29 slices shown (1 of 2)]
[im 1/29]
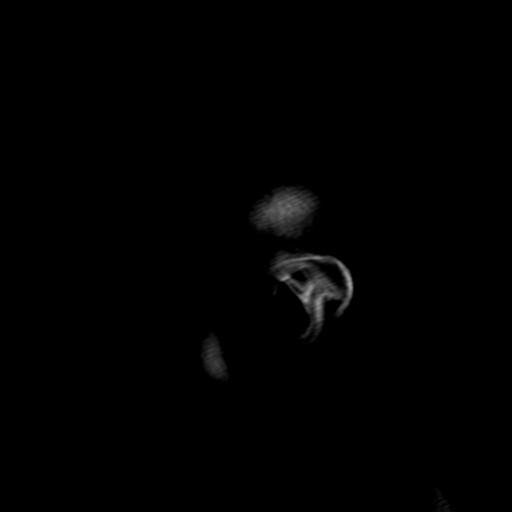
[im 10/29]
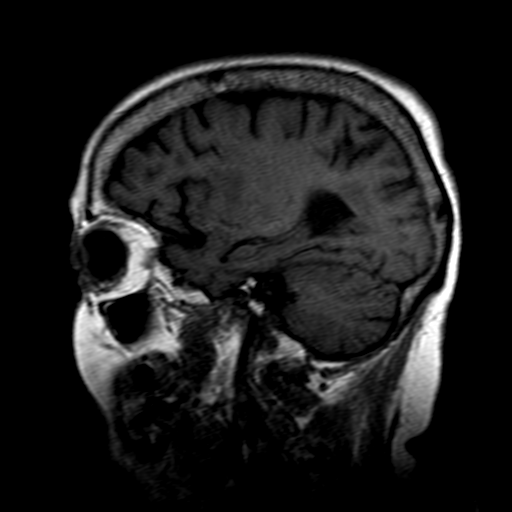
[im 19/29]
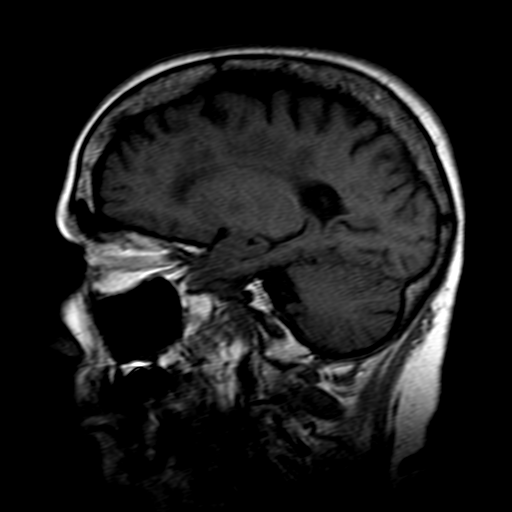
[im 29/29]
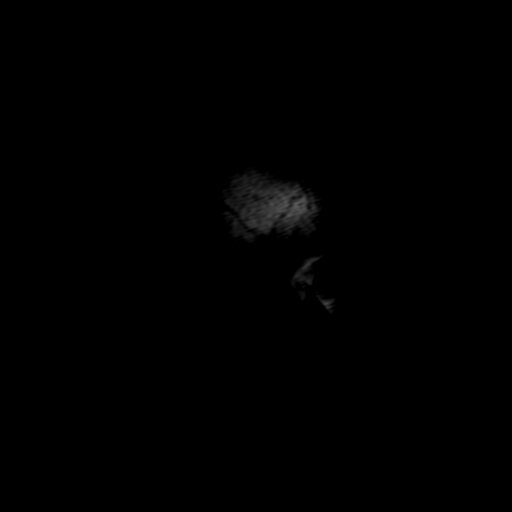

[Series 4: DWI · axial · 4.0mm · 0.94mm/px · z∈[-77,+90]mm · 6 of 43 slices shown (1 of 4)]
[im 1/43]
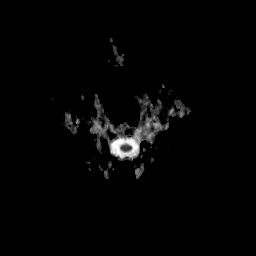
[im 9/43]
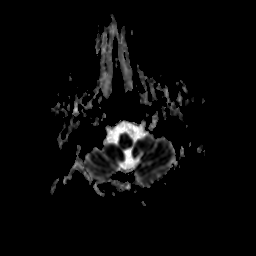
[im 17/43]
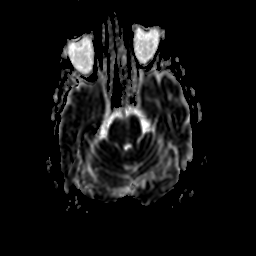
[im 26/43]
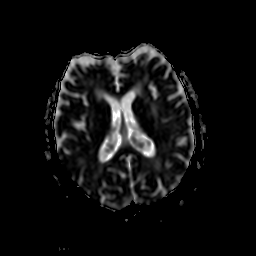
[im 34/43]
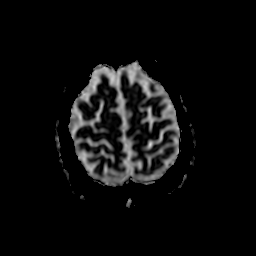
[im 43/43]
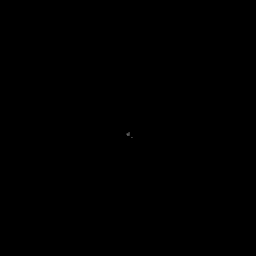

[Series 5: DWI · axial · 4.0mm · 0.94mm/px · z∈[-77,+90]mm · 8 of 85 slices shown (2 of 4)]
[im 1/85]
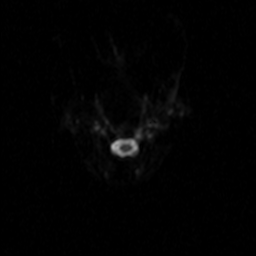
[im 10/85]
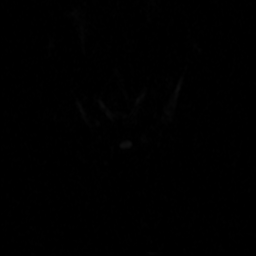
[im 29/85]
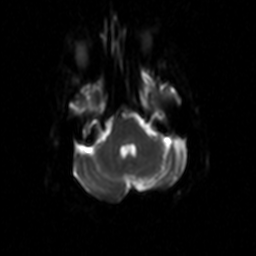
[im 38/85]
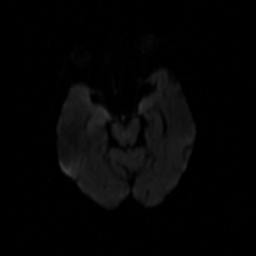
[im 47/85]
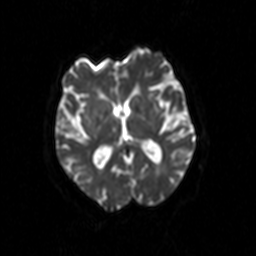
[im 57/85]
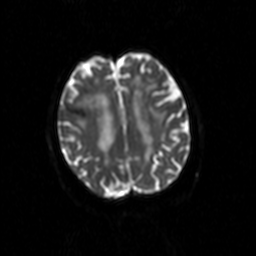
[im 75/85]
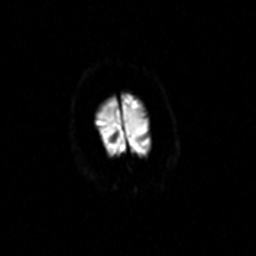
[im 85/85]
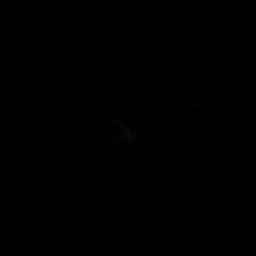

[Series 6: T2 · axial · 5.0mm · 0.45mm/px · z∈[-78,+90]mm · 3 of 27 slices shown (1 of 3)]
[im 1/27]
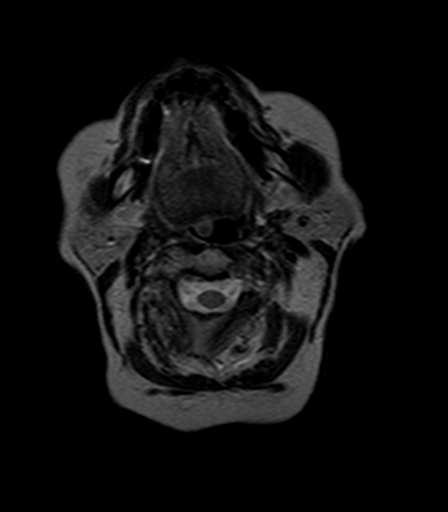
[im 14/27]
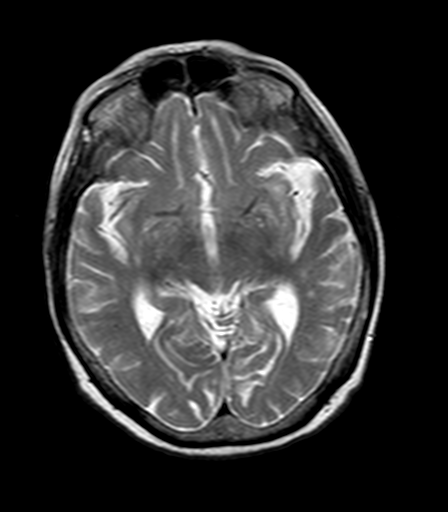
[im 27/27]
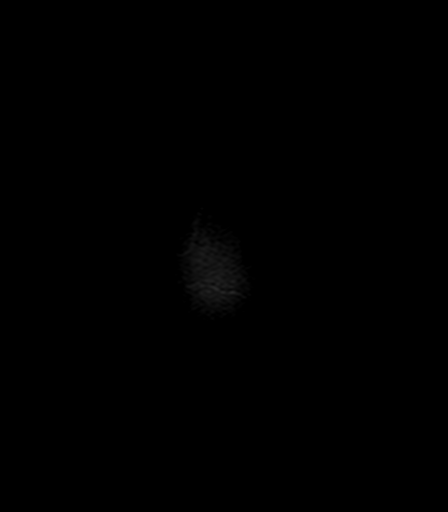

[Series 7: FLAIR · axial · 5.0mm · 0.90mm/px · z∈[-78,+90]mm · 3 of 27 slices shown]
[im 1/27]
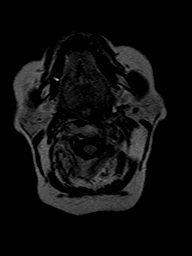
[im 14/27]
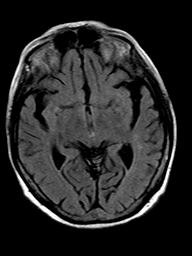
[im 27/27]
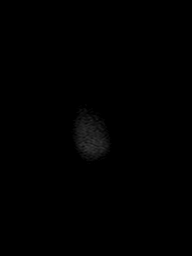

[Series 8: T2 · axial · 5.0mm · 0.45mm/px · z∈[-78,+90]mm · 3 of 27 slices shown (2 of 3)]
[im 1/27]
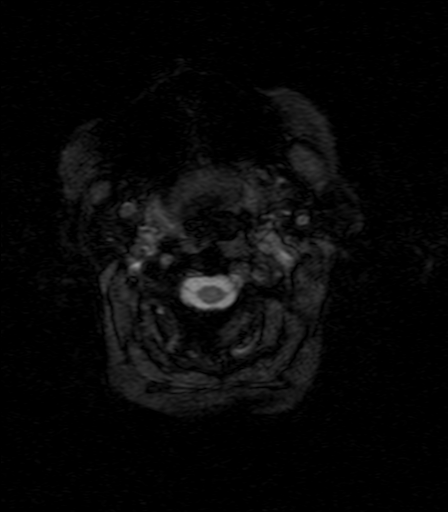
[im 14/27]
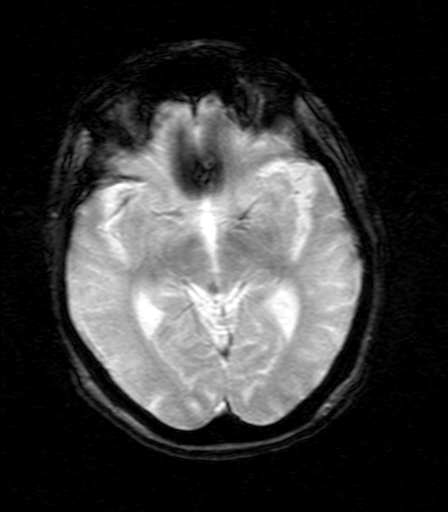
[im 27/27]
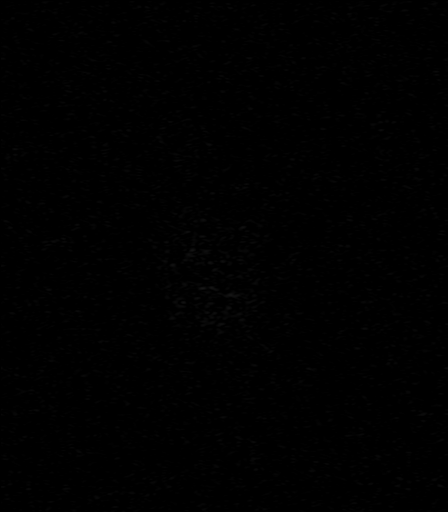

[Series 9: T1 · axial · 3.0mm · 0.45mm/px · 1 of 60 slices shown (2 of 2)]
[im 1/60]
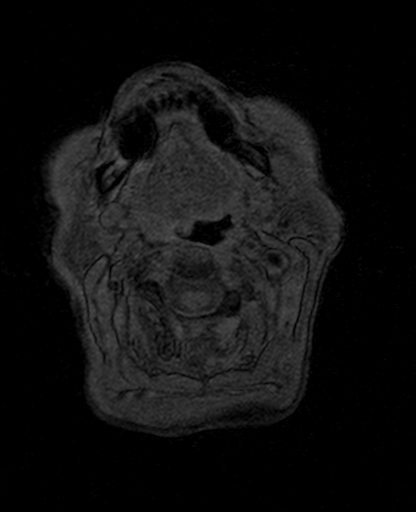

[Series 10: T2 · coronal · 5.0mm · 0.45mm/px · 3 of 29 slices shown (3 of 3)]
[im 1/29]
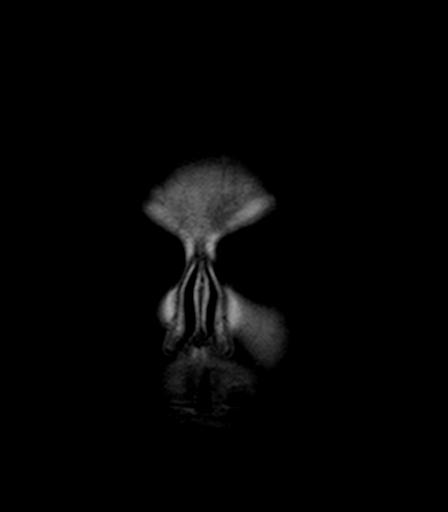
[im 15/29]
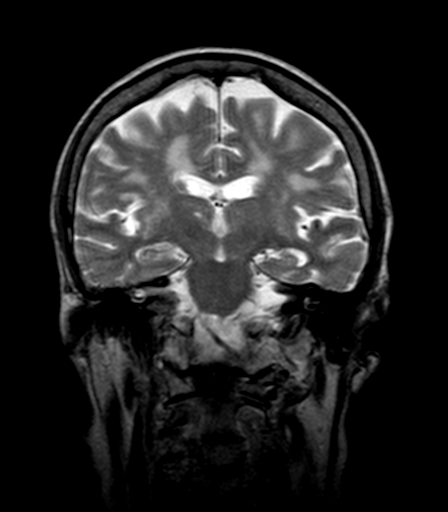
[im 29/29]
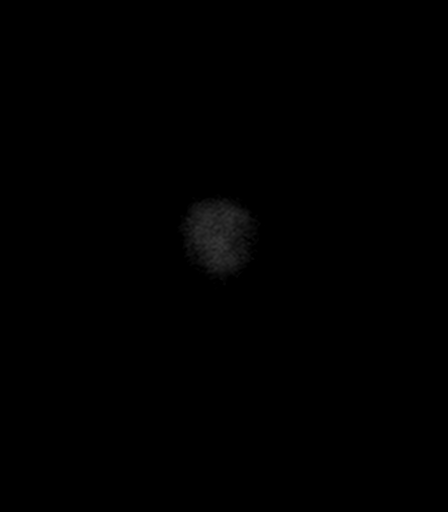

[Series 12: DWI · coronal · 5.0mm · 1.80mm/px · 5 of 39 slices shown (3 of 4)]
[im 1/39]
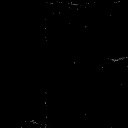
[im 10/39]
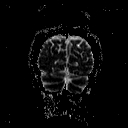
[im 20/39]
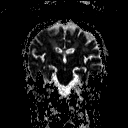
[im 29/39]
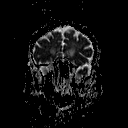
[im 39/39]
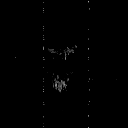

[Series 13: DWI · coronal · 5.0mm · 1.80mm/px · 4 of 35 slices shown (4 of 4)]
[im 1/35]
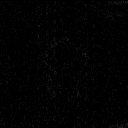
[im 12/35]
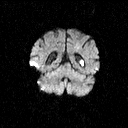
[im 23/35]
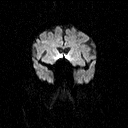
[im 35/35]
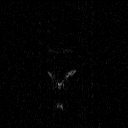

[40 of 48 positions shown; findings below may reference images not displayed]

FINDINGS: Brain: Punctate area of increased trace diffusion signal in the
inferior right parietal lobe white matter on series 5, image 69, but
this is not appear restricted on ADC.

Patchy and confluent bilateral cerebral white matter T2 and FLAIR
hyperintensity, including in the right parietal region. There is
some deep white matter capsule involvement. There is superimposed
mild to moderate T2 heterogeneity throughout the deep gray matter
nuclei. Evidence of a small chronic lacunar infarct in the medial
right thalamus (series 9, image 34). Gyriform hemosiderin in the
anterior right middle frontal gyrus (series 8, image 20). Possible
trace siderosis also along the anterior left frontal lobe on image
17. Possible chronic microhemorrhage in the posterior right temporal
lobe on image 16. No cortical encephalomalacia. Subtle chronic
lacunar infarcts in the bilateral cerebellar hemispheres on series
6, images 8 and 9. Brainstem appears normal.

No restricted diffusion to suggest acute infarction. No midline
shift, mass effect, evidence of mass lesion, ventriculomegaly,
extra-axial collection or acute intracranial hemorrhage.
Cervicomedullary junction and pituitary are within normal limits.

Vascular: Major intracranial vascular flow voids are preserved,
although the distal left vertebral artery appears dominant but with
decreased flow void conspicuity at the skullbase and evidence of
superimposed atherosclerosis.

Skull and upper cervical spine: Negative. Normal bone marrow signal.

Sinuses/Orbits: Normal orbit soft tissues. Prior maxillary
antrostomy and ethmoidectomy with could paranasal sinus
pneumatization.

Other: Visible internal auditory structures appear normal. Mastoids
are clear. Negative scalp soft tissues.
IMPRESSION: 1.  No acute intracranial abnormality.
2. Punctate subacute appearing right parietal lobe white matter
lacunar infarct. Underlying advanced cerebral white matter signal
changes with evidence of lesser chronic small vessel ischemia in the
deep gray matter nuclei and cerebellum.
3. Mild superficial siderosis in the right frontal lobe, possibly
trace involvement also of the anterior left frontal lobe.
